# Patient Record
Sex: Female | Born: 1960 | Race: White | Hispanic: No | Marital: Married | Smoking: Never smoker
Health system: Southern US, Community
[De-identification: ages and names within clinical notes are randomized; demographics above are authoritative.]

## PROBLEM LIST (undated history)

## (undated) DIAGNOSIS — Z974 Presence of external hearing-aid: Secondary | ICD-10-CM

## (undated) DIAGNOSIS — K449 Diaphragmatic hernia without obstruction or gangrene: Secondary | ICD-10-CM

## (undated) DIAGNOSIS — Z86010 Personal history of colonic polyps: Secondary | ICD-10-CM

## (undated) DIAGNOSIS — F32A Depression, unspecified: Secondary | ICD-10-CM

## (undated) DIAGNOSIS — Z8719 Personal history of other diseases of the digestive system: Secondary | ICD-10-CM

## (undated) DIAGNOSIS — K649 Unspecified hemorrhoids: Secondary | ICD-10-CM

## (undated) DIAGNOSIS — K219 Gastro-esophageal reflux disease without esophagitis: Secondary | ICD-10-CM

## (undated) DIAGNOSIS — K6282 Dysplasia of anus: Secondary | ICD-10-CM

## (undated) DIAGNOSIS — D013 Carcinoma in situ of anus and anal canal: Secondary | ICD-10-CM

## (undated) DIAGNOSIS — Z860101 Personal history of adenomatous and serrated colon polyps: Secondary | ICD-10-CM

## (undated) DIAGNOSIS — Z973 Presence of spectacles and contact lenses: Secondary | ICD-10-CM

## (undated) DIAGNOSIS — F329 Major depressive disorder, single episode, unspecified: Secondary | ICD-10-CM

## (undated) HISTORY — PX: COLONOSCOPY: SHX174

## (undated) HISTORY — PX: GANGLION CYST EXCISION: SHX1691

## (undated) HISTORY — DX: Gastro-esophageal reflux disease without esophagitis: K21.9

## (undated) HISTORY — DX: Depression, unspecified: F32.A

## (undated) HISTORY — PX: DE QUERVAIN'S RELEASE: SHX1439

---
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
## 2000-07-29 HISTORY — PX: STAPLE HEMORRHOIDECTOMY: SHX2438

## 2015-07-30 HISTORY — PX: PERCUTANEOUS PINNING TOE FRACTURE: SUR1018

## 2019-03-26 ENCOUNTER — Encounter: Payer: Self-pay | Admitting: Internal Medicine

## 2019-05-14 ENCOUNTER — Ambulatory Visit (AMBULATORY_SURGERY_CENTER): Payer: BC Managed Care – PPO | Admitting: *Deleted

## 2019-05-14 ENCOUNTER — Other Ambulatory Visit: Payer: Self-pay

## 2019-05-14 ENCOUNTER — Encounter: Payer: Self-pay | Admitting: Internal Medicine

## 2019-05-14 VITALS — Ht 66.0 in | Wt 131.0 lb

## 2019-05-14 DIAGNOSIS — Z1211 Encounter for screening for malignant neoplasm of colon: Secondary | ICD-10-CM

## 2019-05-14 MED ORDER — NA SULFATE-K SULFATE-MG SULF 17.5-3.13-1.6 GM/177ML PO SOLN: ORAL | 0 refills | Status: DC

## 2019-05-14 NOTE — Progress Notes (Signed)
Patient's pre-visit was done today over the phone with the patient due to COVID-19 pandemic. Name,DOB and address verified. Insurance verified. Packet of Prep instructions mailed to patient including copy of a consent form and pre-procedure patient acknowledgement form-pt is aware. Suprep $15 off Coupon included. Patient understands to call us back with any questions or concerns.   Pt is aware that care partner will wait in the car during procedure; if they feel like they will be too hot to wait in the car; they may wait in the lobby. Patient is aware to bring only one care partner. We want them to wear a mask (we do not have any that we can provide them), practice social distancing, and we will check their temperatures when they get here.  I did remind patient that their care partner needs to stay in the parking lot the entire time. Pt will wear mask into building.  Patient declined Covid screening test, she lives in [place].

## 2019-05-28 ENCOUNTER — Other Ambulatory Visit: Payer: Self-pay

## 2019-05-28 ENCOUNTER — Ambulatory Visit (AMBULATORY_SURGERY_CENTER): Payer: BC Managed Care – PPO | Admitting: Internal Medicine

## 2019-05-28 ENCOUNTER — Other Ambulatory Visit: Payer: Self-pay | Admitting: Internal Medicine

## 2019-05-28 ENCOUNTER — Encounter: Payer: Self-pay | Admitting: Internal Medicine

## 2019-05-28 VITALS — BP 94/71 | HR 52 | Temp 98.1°F | Resp 13 | Ht 66.0 in | Wt 131.0 lb

## 2019-05-28 DIAGNOSIS — Z1211 Encounter for screening for malignant neoplasm of colon: Secondary | ICD-10-CM

## 2019-05-28 DIAGNOSIS — D129 Benign neoplasm of anus and anal canal: Secondary | ICD-10-CM | POA: Diagnosis not present

## 2019-05-28 DIAGNOSIS — D124 Benign neoplasm of descending colon: Secondary | ICD-10-CM | POA: Diagnosis not present

## 2019-05-28 DIAGNOSIS — D123 Benign neoplasm of transverse colon: Secondary | ICD-10-CM | POA: Diagnosis not present

## 2019-05-28 MED ORDER — SODIUM CHLORIDE 0.9 % IV SOLN: 500.0000 mL | Freq: Once | INTRAVENOUS | Status: DC

## 2019-05-28 NOTE — Progress Notes (Signed)
A and O x3. Report to RN. Tolerated MAC anesthesia well.

## 2019-05-28 NOTE — Patient Instructions (Signed)
HANDOUTS given for polyps and hemorrhoids.  YOU HAD AN ENDOSCOPIC PROCEDURE TODAY AT THE [facility]:   Refer to the procedure report that was given to you for any specific questions about what was found during the examination.  If the procedure report does not answer your questions, please call your gastroenterologist to clarify.  If you requested that your care partner not be given the details of your procedure findings, then the procedure report has been included in a sealed envelope for you to review at your convenience later.  YOU SHOULD EXPECT: Some feelings of bloating in the abdomen. Passage of more gas than usual.  Walking can help get rid of the air that was put into your GI tract during the procedure and reduce the bloating. If you had a lower endoscopy (such as a colonoscopy or flexible sigmoidoscopy) you may notice spotting of blood in your stool or on the toilet paper. If you underwent a bowel prep for your procedure, you may not have a normal bowel movement for a few days.  Please Note:  You might notice some irritation and congestion in your nose or some drainage.  This is from the oxygen used during your procedure.  There is no need for concern and it should clear up in a day or so.  SYMPTOMS TO REPORT IMMEDIATELY:   Following lower endoscopy (colonoscopy or flexible sigmoidoscopy):  Excessive amounts of blood in the stool  Significant tenderness or worsening of abdominal pains  Swelling of the abdomen that is new, acute  Fever of 100F or higher  For urgent or emergent issues, a gastroenterologist can be reached at any hour by calling [phone].   DIET:  We do recommend a small meal at first, but then you may proceed to your regular diet.  Drink plenty of fluids but you should avoid alcoholic beverages for 24 hours.  ACTIVITY:  You should plan to take it easy for the rest of today and you should NOT DRIVE or use heavy machinery until tomorrow (because of the  sedation medicines used during the test).    FOLLOW UP: Our staff will call the number listed on your records 48-72 hours following your procedure to check on you and address any questions or concerns that you may have regarding the information given to you following your procedure. If we do not reach you, we will leave a message.  We will attempt to reach you two times.  During this call, we will ask if you have developed any symptoms of COVID 19. If you develop any symptoms (ie: fever, flu-like symptoms, shortness of breath, cough etc.) before then, please call [phone].  If you test positive for Covid 19 in the 2 weeks post procedure, please call and report this information to us.    If any biopsies were taken you will be contacted by phone or by letter within the next 1-3 weeks.  Please call us at [phone] if you have not heard about the biopsies in 3 weeks.    SIGNATURES/CONFIDENTIALITY: You and/or your care partner have signed paperwork which will be entered into your electronic medical record.  These signatures attest to the fact that that the information above on your After Visit Summary has been reviewed and is understood.  Full responsibility of the confidentiality of this discharge information lies with you and/or your care-partner. 

## 2019-05-28 NOTE — Op Note (Signed)
[facility] Patient Name: Joan Bailey Procedure Date: 05/28/2019 9:58 AM MRN: [id number] Endoscopist: Jerene Bears , MD Age: 58 Referring MD:  Date of Birth: 1961-02-16 Gender: Female Account #: [id number] Procedure:                Colonoscopy Indications:              Screening for colorectal malignant neoplasm, Last                            colonoscopy 10 years ago Medicines:                Monitored Anesthesia Care Procedure:                Pre-Anesthesia Assessment:                           - Prior to the procedure, a History and Physical                            was performed, and patient medications and                            allergies were reviewed. The patient's tolerance of                            previous anesthesia was also reviewed. The risks                            and benefits of the procedure and the sedation                            options and risks were discussed with the patient.                            All questions were answered, and informed consent                            was obtained. Prior Anticoagulants: The patient has                            taken no previous anticoagulant or antiplatelet                            agents. ASA Grade Assessment: II - A patient with                            mild systemic disease. After reviewing the risks                            and benefits, the patient was deemed in                            satisfactory condition to undergo the procedure.  After obtaining informed consent, the colonoscope                            was passed under direct vision. Throughout the                            procedure, the patient's blood pressure, pulse, and                            oxygen saturations were monitored continuously. The                            Colonoscope was introduced through the anus and                            advanced to the cecum, identified by  appendiceal                            orifice and ileocecal valve. The colonoscopy was                            performed without difficulty. The patient tolerated                            the procedure well. The quality of the bowel                            preparation was good. The ileocecal valve,                            appendiceal orifice, and rectum were photographed. Scope In: 10:04:45 AM Scope Out: 10:31:59 AM Scope Withdrawal Time: 0 hours 18 minutes 48 seconds  Total Procedure Duration: 0 hours 27 minutes 14 seconds  Findings:                 The digital rectal exam was normal.                           A 5 mm polyp was found in the transverse colon. The                            polyp was sessile. The polyp was removed with a                            cold snare. Resection and retrieval were complete.                           A 4 mm polyp was found in the descending colon. The                            polyp was sessile. The polyp was removed with a  cold biopsy forceps. Resection and retrieval were                            complete.                           A 5 mm polyp was found in the descending colon. The                            polyp was sessile. The polyp was removed with a                            cold snare. Resection and retrieval were complete.                           A 3 mm polyp was found in the anus. The polyp was                            sessile. Biopsies were taken with a cold forceps                            for histology to exclude AIN.                           There was evidence of a prior hemorrhoidectomy in                            the distal rectum. A staple was still present.                           External and internal hemorrhoids were found during                            retroflexion and during digital exam. The                            hemorrhoids were small. Complications:            No  immediate complications. Estimated Blood Loss:     Estimated blood loss was minimal. Impression:               - One 5 mm polyp in the transverse colon, removed                            with a cold snare. Resected and retrieved.                           - One 4 mm polyp in the descending colon, removed                            with a cold biopsy forceps. Resected and retrieved.                           - One 5 mm  polyp in the descending colon, removed                            with a cold snare. Resected and retrieved.                           - One 3 mm polyp in the anal canal. Biopsied to                            exclude AIN.                           - Evidence of previous hemorrhoidectomy,                            characterized by a visible staple.                           - Small external and internal hemorrhoids. Recommendation:           - Patient has a contact number available for                            emergencies. The signs and symptoms of potential                            delayed complications were discussed with the                            patient. Return to normal activities tomorrow.                            Written discharge instructions were provided to the                            patient.                           - Resume previous diet.                           - Continue present medications.                           - Await pathology results.                           - Repeat colonoscopy is recommended. The                            colonoscopy date will be determined after pathology                            results from today's exam become available for                            review.  Jerene Bears, MD 05/28/2019 10:40:12 AM This report has been signed electronically.

## 2019-05-28 NOTE — Progress Notes (Signed)
Called to room to assist during endoscopic procedure.  Patient ID and intended procedure confirmed with present staff. Received instructions for my participation in the procedure from the performing physician.  

## 2019-06-01 ENCOUNTER — Telehealth: Payer: Self-pay

## 2019-06-01 NOTE — Telephone Encounter (Signed)
  Follow up Call-  Call back number 05/28/2019  Post procedure Call Back phone  # [phone]  Permission to leave phone message Yes     Patient questions:  Do you have a fever, pain , or abdominal swelling? No. Pain Score  0 *  Have you tolerated food without any problems? Yes.    Have you been able to return to your normal activities? Yes.    Do you have any questions about your discharge instructions: Diet   No. Medications  No. Follow up visit  No.  Do you have questions or concerns about your Care? No.  Actions: * If pain score is 4 or above: No action needed, pain <4.  1. Have you developed a fever since your procedure? no  2.   Have you had an respiratory symptoms (SOB or cough) since your procedure? no  3.   Have you tested positive for COVID 19 since your procedure no  4.   Have you had any family members/close contacts diagnosed with the COVID 19 since your procedure?  no   If yes to any of these questions please route to Joylene John, RN and Alphonsa Gin, Therapist, sports.

## 2019-06-02 ENCOUNTER — Encounter: Payer: Self-pay | Admitting: Internal Medicine

## 2019-06-07 ENCOUNTER — Telehealth: Payer: Self-pay | Admitting: Internal Medicine

## 2019-06-07 NOTE — Telephone Encounter (Signed)
Definitely okay to wait for this appointment; this issue is not super urgent

## 2019-06-07 NOTE — Telephone Encounter (Signed)
Pt wants to know if her appt with [facility] on 11/30 is ok or if she should try to be seen somewhere else. She wanted to know if this needed to be sooner, if Dr. Hilarie Fredrickson feels it is urgent. Please advise.

## 2019-06-07 NOTE — Telephone Encounter (Signed)
Spoke with pt and she is aware.

## 2019-06-28 ENCOUNTER — Ambulatory Visit: Payer: Self-pay | Admitting: General Surgery

## 2019-06-28 NOTE — H&P (Signed)
History of Present Illness Joan Ruff MD; [id number] 3:07 PM) The patient is a 58 year old female who presents with anal lesions. 58 year old female who presents to the office for evaluation of AIN seen on recent colonoscopy. 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 Colonoscopy report does not mention any other lesions. She is currently asymptomatic. She is status post stapled hemorrhoid pexy in the past. She has no rectal bleeding currently and no anal pain. She occasionally has trouble with anal fissures.   Past Surgical History Mammie Lorenzo, LPN; QA348G X33443 PM) Colon Polyp Removal - Colonoscopy Hemorrhoidectomy  Diagnostic Studies History Mammie Lorenzo, LPN; QA348G X33443 PM) Colonoscopy within last year Mammogram within last year Pap Smear 1-5 years ago  Allergies Mammie Lorenzo, LPN; QA348G 624THL PM) No Known Drug Allergies [06/28/2019]: Allergies Reconciled  Medication History Mammie Lorenzo, LPN; QA348G 624THL PM) Omeprazole (40MG  Capsule DR, Oral) Active. Sertraline HCl (50MG  Tablet, Oral) Active. Tums (500MG  Tablet Chewable, Oral as needed) Active. Medications Reconciled  Social History Mammie Lorenzo, LPN; QA348G X33443 PM) Alcohol use Occasional alcohol use. Caffeine use Coffee. No drug use Tobacco use Never smoker.  Family History Mammie Lorenzo, LPN; QA348G X33443 PM) Depression Sister. Diabetes Mellitus Brother. Heart Disease Father. Malignant Neoplasm Of Pancreas Mother.  Pregnancy / Birth History Mammie Lorenzo, LPN; QA348G X33443 PM) Age of menopause 46-50 Gravida 0 Para 0  Other Problems Mammie Lorenzo, LPN; QA348G X33443 PM) Depression Gastroesophageal Reflux Disease Hemorrhoids     Review of Systems Claiborne Billings Dockery LPN; QA348G X33443 PM) General Not Present- Appetite Loss, Chills, Fatigue, Fever, Night Sweats, Weight Gain and Weight Loss. Skin Not Present- Change in Wart/Mole,  Dryness, Hives, Jaundice, New Lesions, Non-Healing Wounds, Rash and Ulcer. HEENT Present- Hearing Loss, Ringing in the Ears and Wears glasses/contact lenses. Not Present- Earache, Hoarseness, Nose Bleed, Oral Ulcers, Seasonal Allergies, Sinus Pain, Sore Throat, Visual Disturbances and Yellow Eyes. Respiratory Not Present- Bloody sputum, Chronic Cough, Difficulty Breathing, Snoring and Wheezing. Cardiovascular Not Present- Chest Pain, Difficulty Breathing Lying Down, Leg Cramps, Palpitations, Rapid Heart Rate, Shortness of Breath and Swelling of Extremities. Gastrointestinal Present- Hemorrhoids. Not Present- Abdominal Pain, Bloating, Bloody Stool, Change in Bowel Habits, Chronic diarrhea, Constipation, Difficulty Swallowing, Excessive gas, Gets full quickly at meals, Indigestion, Nausea, Rectal Pain and Vomiting. Female Genitourinary Not Present- Frequency, Nocturia, Painful Urination, Pelvic Pain and Urgency. Musculoskeletal Not Present- Back Pain, Joint Pain, Joint Stiffness, Muscle Pain, Muscle Weakness and Swelling of Extremities. Neurological Not Present- Decreased Memory, Fainting, Headaches, Numbness, Seizures, Tingling, Tremor, Trouble walking and Weakness. Psychiatric Present- Depression. Not Present- Anxiety, Bipolar, Change in Sleep Pattern, Fearful and Frequent crying. Endocrine Not Present- Cold Intolerance, Excessive Hunger, Hair Changes, Heat Intolerance, Hot flashes and New Diabetes. Hematology Not Present- Blood Thinners, Easy Bruising, Excessive bleeding, Gland problems, HIV and Persistent Infections.  Vitals Claiborne Billings Dockery LPN; QA348G 624THL PM) 06/28/2019 3:00 PM Weight: 131.4 lb Height: 66in Body Surface Area: 1.67 m Body Mass Index: 21.21 kg/m  Temp.: 97.42F(Thermal Scan)  Pulse: 57 (Regular)  BP: 118/68 (Sitting, Left Arm, Standard)        Physical Exam Joan Ruff MD; [id number] 3:20 PM)  General Mental Status-Alert. General Appearance-Not  in acute distress. Build & Nutrition-Well nourished. Posture-Normal posture. Gait-Normal.  Head and Neck Head-normocephalic, atraumatic with no lesions or palpable masses. Trachea-midline.  Chest and Lung Exam Chest and lung exam reveals -normal excursion with symmetric chest walls.  Cardiovascular Cardiovascular examination reveals -no digital clubbing, cyanosis, edema, increased warmth or tenderness. Auscultation Rhythm -  Regular.  Abdomen Inspection Inspection of the abdomen reveals - No Hernias. Palpation/Percussion Palpation and Percussion of the abdomen reveal - Soft, Non Tender, No Rigidity (guarding), No hepatosplenomegaly and No Palpable abdominal masses.  Rectal Anorectal Exam External - normal external exam. Internal - normal internal exam.  Neurologic Neurologic evaluation reveals -alert and oriented x 3 with no impairment of recent or remote memory, normal attention span and ability to concentrate, normal sensation and normal coordination.  Musculoskeletal Normal Exam - Bilateral-Upper Extremity Strength Normal and Lower Extremity Strength Normal.   Results Joan Ruff MD; [id number] 3:22 PM) Procedures  Name Value Date ANOSCOPY, DIAGNOSTIC [id number]) [ Hemorrhoids ] Procedure Other: Procedure: Anoscopy....Marland KitchenMarland KitchenSurgeon: Marcello Moores....Marland KitchenMarland KitchenAfter the risks and benefits were explained, verbal consent was obtained for above procedure. A medical assistant chaperone was present thoroughout the entire procedure. ....Marland KitchenMarland KitchenAnesthesia: none....Marland KitchenMarland KitchenDiagnosis: And grade 2....Marland KitchenMarland KitchenFindings: No discrete lesions noted, some discoloration noted around the dentate line circumferentially. no hemorrhoid disease.  Performed: 06/28/2019 3:21 PM    Assessment & Plan Joan Ruff MD; [id number] 3:24 PM)  AIN GRADE II [id number]) Impression: Patient underwent recent colonoscopy. An anal polyp was noted. 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 She is here today for further  evaluation. On exam I do not see any discrete lesions. We discussed serial examinations in the office versus high-resolution anoscopy. We discussed that there is no additional benefit to either one of these procedures when compared to the other based on the literature but there seems to be a theoretical benefit high-resolution anoscopy. The patient has decided to proceed with high-resolution anoscopy. We will biopsy any abnormal lesions. If there are any discrete areas of concern, we will perform laser ablation. We discussed typical postoperative pain and bleeding. We discussed the need for ongoing follow-up.  All questions were answered.

## 2019-09-06 ENCOUNTER — Other Ambulatory Visit (HOSPITAL_COMMUNITY): Payer: BC Managed Care – PPO

## 2019-10-04 ENCOUNTER — Ambulatory Visit: Payer: Self-pay | Admitting: General Surgery

## 2019-10-07 ENCOUNTER — Encounter (HOSPITAL_BASED_OUTPATIENT_CLINIC_OR_DEPARTMENT_OTHER): Payer: Self-pay | Admitting: General Surgery

## 2019-10-07 ENCOUNTER — Ambulatory Visit: Payer: Self-pay | Admitting: General Surgery

## 2019-10-08 ENCOUNTER — Encounter (HOSPITAL_BASED_OUTPATIENT_CLINIC_OR_DEPARTMENT_OTHER): Payer: Self-pay | Admitting: General Surgery

## 2019-10-08 ENCOUNTER — Other Ambulatory Visit: Payer: Self-pay

## 2019-10-08 NOTE — Progress Notes (Signed)
Spoke w/ via phone for pre-op interview--- PT Lab needs dos---- none              Lab results------ no COVID test ------ 10-11-2019 @ [id number] Arrive at ------- 0645 NPO after ------ MN Medications to take morning of surgery ----- Prilosec, Zoloft w/ sips of water Diabetic medication ----- n/a Patient Special Instructions ----- n/a Pre-Op special Istructions ----- n/a Patient verbalized understanding of instructions that were given at this phone interview. Patient denies shortness of breath, chest pain, fever, cough a this phone interview.

## 2019-10-11 ENCOUNTER — Other Ambulatory Visit (HOSPITAL_COMMUNITY)
Admission: RE | Admit: 2019-10-11 | Discharge: 2019-10-11 | Disposition: A | Discharge status: Home / Self Care | Payer: BC Managed Care – PPO | Source: Ambulatory Visit | Attending: General Surgery | Admitting: General Surgery

## 2019-10-11 DIAGNOSIS — Z01812 Encounter for preprocedural laboratory examination: Secondary | ICD-10-CM | POA: Insufficient documentation

## 2019-10-11 DIAGNOSIS — Z20822 Contact with and (suspected) exposure to covid-19: Secondary | ICD-10-CM | POA: Diagnosis not present

## 2019-10-12 LAB — SARS CORONAVIRUS 2 (TAT 6-24 HRS): SARS Coronavirus 2: NEGATIVE

## 2019-10-14 ENCOUNTER — Ambulatory Visit (HOSPITAL_BASED_OUTPATIENT_CLINIC_OR_DEPARTMENT_OTHER): Payer: BC Managed Care – PPO | Admitting: Anesthesiology

## 2019-10-14 ENCOUNTER — Encounter (HOSPITAL_BASED_OUTPATIENT_CLINIC_OR_DEPARTMENT_OTHER)
Admission: RE | Disposition: A | Discharge status: Home / Self Care | Payer: Self-pay | Source: Ambulatory Visit | Attending: General Surgery

## 2019-10-14 ENCOUNTER — Other Ambulatory Visit: Payer: Self-pay

## 2019-10-14 ENCOUNTER — Encounter (HOSPITAL_BASED_OUTPATIENT_CLINIC_OR_DEPARTMENT_OTHER): Payer: Self-pay | Admitting: General Surgery

## 2019-10-14 ENCOUNTER — Ambulatory Visit (HOSPITAL_BASED_OUTPATIENT_CLINIC_OR_DEPARTMENT_OTHER)
Admission: RE | Admit: 2019-10-14 | Discharge: 2019-10-14 | Disposition: A | Discharge status: Home / Self Care | Payer: BC Managed Care – PPO | Source: Ambulatory Visit | Attending: General Surgery | Admitting: General Surgery

## 2019-10-14 DIAGNOSIS — K6282 Dysplasia of anus: Secondary | ICD-10-CM | POA: Insufficient documentation

## 2019-10-14 DIAGNOSIS — K620 Anal polyp: Secondary | ICD-10-CM | POA: Insufficient documentation

## 2019-10-14 DIAGNOSIS — K219 Gastro-esophageal reflux disease without esophagitis: Secondary | ICD-10-CM | POA: Insufficient documentation

## 2019-10-14 DIAGNOSIS — K449 Diaphragmatic hernia without obstruction or gangrene: Secondary | ICD-10-CM | POA: Diagnosis not present

## 2019-10-14 DIAGNOSIS — Z79899 Other long term (current) drug therapy: Secondary | ICD-10-CM | POA: Insufficient documentation

## 2019-10-14 DIAGNOSIS — Z800 Family history of malignant neoplasm of digestive organs: Secondary | ICD-10-CM | POA: Diagnosis not present

## 2019-10-14 DIAGNOSIS — Z8249 Family history of ischemic heart disease and other diseases of the circulatory system: Secondary | ICD-10-CM | POA: Diagnosis not present

## 2019-10-14 DIAGNOSIS — Z833 Family history of diabetes mellitus: Secondary | ICD-10-CM | POA: Insufficient documentation

## 2019-10-14 DIAGNOSIS — Z86010 Personal history of colonic polyps: Secondary | ICD-10-CM | POA: Diagnosis not present

## 2019-10-14 DIAGNOSIS — F329 Major depressive disorder, single episode, unspecified: Secondary | ICD-10-CM | POA: Diagnosis not present

## 2019-10-14 DIAGNOSIS — Z818 Family history of other mental and behavioral disorders: Secondary | ICD-10-CM | POA: Diagnosis not present

## 2019-10-14 HISTORY — DX: Dysplasia of anus: K62.82

## 2019-10-14 HISTORY — DX: Presence of external hearing-aid: Z97.4

## 2019-10-14 HISTORY — PX: HIGH RESOLUTION ANOSCOPY: SHX6345

## 2019-10-14 HISTORY — DX: Personal history of other diseases of the digestive system: Z87.19

## 2019-10-14 HISTORY — DX: Personal history of colonic polyps: Z86.010

## 2019-10-14 HISTORY — DX: Unspecified hemorrhoids: K64.9

## 2019-10-14 HISTORY — DX: Personal history of adenomatous and serrated colon polyps: Z86.0101

## 2019-10-14 HISTORY — DX: Diaphragmatic hernia without obstruction or gangrene: K44.9

## 2019-10-14 HISTORY — DX: Presence of spectacles and contact lenses: Z97.3

## 2019-10-14 SURGERY — ANOSCOPY, HIGH RESOLUTION
Anesthesia: Monitor Anesthesia Care | Site: Rectum

## 2019-10-14 MED ORDER — SODIUM CHLORIDE 0.9 % IV SOLN
INTRAVENOUS | Status: DC | PRN
  Administered 2019-10-14: 20 ug/kg/min via INTRAVENOUS

## 2019-10-14 MED ORDER — BUPIVACAINE LIPOSOME 1.3 % IJ SUSP
INTRAMUSCULAR | Status: DC | PRN
  Administered 2019-10-14: 20 mL

## 2019-10-14 MED ORDER — SODIUM CHLORIDE 0.9% FLUSH
3.0000 mL | INTRAVENOUS | Status: DC | PRN
  Filled 2019-10-14: qty 3

## 2019-10-14 MED ORDER — MEPERIDINE HCL 25 MG/ML IJ SOLN
6.2500 mg | INTRAMUSCULAR | Status: DC | PRN
  Filled 2019-10-14: qty 1

## 2019-10-14 MED ORDER — MIDAZOLAM HCL 2 MG/2ML IJ SOLN
INTRAMUSCULAR | Status: AC
  Filled 2019-10-14: qty 2

## 2019-10-14 MED ORDER — ACETAMINOPHEN 500 MG PO TABS
1000.0000 mg | ORAL_TABLET | ORAL | Status: AC
  Administered 2019-10-14: 1000 mg via ORAL
  Filled 2019-10-14: qty 2

## 2019-10-14 MED ORDER — ACETAMINOPHEN 650 MG RE SUPP
650.0000 mg | RECTAL | Status: DC | PRN
  Filled 2019-10-14: qty 1

## 2019-10-14 MED ORDER — SODIUM CHLORIDE 0.9 % IV SOLN
250.0000 mL | INTRAVENOUS | Status: DC | PRN
  Filled 2019-10-14: qty 250

## 2019-10-14 MED ORDER — LIDOCAINE 2% (20 MG/ML) 5 ML SYRINGE
INTRAMUSCULAR | Status: AC
  Filled 2019-10-14: qty 5

## 2019-10-14 MED ORDER — ACETIC ACID 5 % SOLN
Status: DC | PRN
  Administered 2019-10-14: 1 via TOPICAL

## 2019-10-14 MED ORDER — MIDAZOLAM HCL 2 MG/2ML IJ SOLN
INTRAMUSCULAR | Status: DC | PRN
  Administered 2019-10-14: 2 mg via INTRAVENOUS

## 2019-10-14 MED ORDER — ACETAMINOPHEN 325 MG PO TABS
325.0000 mg | ORAL_TABLET | ORAL | Status: DC | PRN
  Filled 2019-10-14: qty 2

## 2019-10-14 MED ORDER — OXYCODONE HCL 5 MG/5ML PO SOLN
5.0000 mg | Freq: Once | ORAL | Status: DC | PRN
  Filled 2019-10-14: qty 5

## 2019-10-14 MED ORDER — TRAMADOL HCL 50 MG PO TABS: 50.0000 mg | ORAL_TABLET | Freq: Four times a day (QID) | ORAL | 0 refills | Status: DC | PRN

## 2019-10-14 MED ORDER — BUPIVACAINE-EPINEPHRINE 0.5% -1:200000 IJ SOLN
INTRAMUSCULAR | Status: DC | PRN
  Administered 2019-10-14: 30 mL

## 2019-10-14 MED ORDER — OXYCODONE HCL 5 MG PO TABS
5.0000 mg | ORAL_TABLET | ORAL | Status: DC | PRN
  Filled 2019-10-14: qty 2

## 2019-10-14 MED ORDER — FENTANYL CITRATE (PF) 100 MCG/2ML IJ SOLN
25.0000 ug | INTRAMUSCULAR | Status: DC | PRN
  Filled 2019-10-14: qty 1

## 2019-10-14 MED ORDER — ACETAMINOPHEN 500 MG PO TABS
ORAL_TABLET | ORAL | Status: AC
  Filled 2019-10-14: qty 2

## 2019-10-14 MED ORDER — LACTATED RINGERS IV SOLN
INTRAVENOUS | Status: DC
  Filled 2019-10-14: qty 1000

## 2019-10-14 MED ORDER — ONDANSETRON HCL 4 MG/2ML IJ SOLN
INTRAMUSCULAR | Status: AC
  Filled 2019-10-14: qty 2

## 2019-10-14 MED ORDER — ONDANSETRON HCL 4 MG/2ML IJ SOLN
4.0000 mg | Freq: Once | INTRAMUSCULAR | Status: DC | PRN
  Filled 2019-10-14: qty 2

## 2019-10-14 MED ORDER — OXYCODONE HCL 5 MG PO TABS
5.0000 mg | ORAL_TABLET | Freq: Once | ORAL | Status: DC | PRN
  Filled 2019-10-14: qty 1

## 2019-10-14 MED ORDER — DEXAMETHASONE SODIUM PHOSPHATE 4 MG/ML IJ SOLN
INTRAMUSCULAR | Status: DC | PRN
  Administered 2019-10-14: 5 mg via INTRAVENOUS

## 2019-10-14 MED ORDER — ACETAMINOPHEN 160 MG/5ML PO SOLN
325.0000 mg | ORAL | Status: DC | PRN
  Filled 2019-10-14: qty 20.3

## 2019-10-14 MED ORDER — DEXAMETHASONE SODIUM PHOSPHATE 10 MG/ML IJ SOLN
INTRAMUSCULAR | Status: AC
  Filled 2019-10-14: qty 1

## 2019-10-14 MED ORDER — PROPOFOL 500 MG/50ML IV EMUL
INTRAVENOUS | Status: AC
  Filled 2019-10-14: qty 50

## 2019-10-14 MED ORDER — PROPOFOL 500 MG/50ML IV EMUL
INTRAVENOUS | Status: DC | PRN
  Administered 2019-10-14: 200 ug/kg/min via INTRAVENOUS

## 2019-10-14 MED ORDER — LIDOCAINE 2% (20 MG/ML) 5 ML SYRINGE
INTRAMUSCULAR | Status: DC | PRN
  Administered 2019-10-14: 25 mg via INTRAVENOUS

## 2019-10-14 MED ORDER — ONDANSETRON HCL 4 MG/2ML IJ SOLN
INTRAMUSCULAR | Status: DC | PRN
  Administered 2019-10-14: 4 mg via INTRAVENOUS

## 2019-10-14 MED ORDER — SODIUM CHLORIDE 0.9% FLUSH
3.0000 mL | Freq: Two times a day (BID) | INTRAVENOUS | Status: DC
  Filled 2019-10-14: qty 3

## 2019-10-14 MED ORDER — ACETAMINOPHEN 325 MG PO TABS
650.0000 mg | ORAL_TABLET | ORAL | Status: DC | PRN
  Filled 2019-10-14: qty 2

## 2019-10-14 SURGICAL SUPPLY — 50 items
APPLICATOR COTTON TIP 6 STRL (MISCELLANEOUS) IMPLANT
APPLICATOR COTTON TIP 6IN STRL (MISCELLANEOUS)
BENZOIN TINCTURE PRP APPL 2/3 (GAUZE/BANDAGES/DRESSINGS) ×4 IMPLANT
BLADE EXTENDED COATED 6.5IN (ELECTRODE) IMPLANT
BLADE HEX COATED 2.75 (ELECTRODE) ×4 IMPLANT
BLADE SURG 10 STRL SS (BLADE) ×4 IMPLANT
BRIEF STRETCH FOR OB PAD LRG (UNDERPADS AND DIAPERS) ×4 IMPLANT
CANISTER SUCT 3000ML PPV (MISCELLANEOUS) ×4 IMPLANT
COVER BACK TABLE 60X90IN (DRAPES) ×4 IMPLANT
COVER MAYO STAND STRL (DRAPES) ×4 IMPLANT
COVER WAND RF STERILE (DRAPES) ×8 IMPLANT
DECANTER SPIKE VIAL GLASS SM (MISCELLANEOUS) ×4 IMPLANT
DRAPE LAPAROTOMY 100X72 PEDS (DRAPES) ×4 IMPLANT
DRAPE UTILITY XL STRL (DRAPES) ×4 IMPLANT
DRSG PAD ABDOMINAL 8X10 ST (GAUZE/BANDAGES/DRESSINGS) ×4 IMPLANT
ELECT REM PT RETURN 9FT ADLT (ELECTROSURGICAL) ×4
ELECTRODE REM PT RTRN 9FT ADLT (ELECTROSURGICAL) ×2 IMPLANT
GAUZE SPONGE 4X4 12PLY STRL (GAUZE/BANDAGES/DRESSINGS) ×4 IMPLANT
GLOVE BIO SURGEON STRL SZ 6.5 (GLOVE) ×3 IMPLANT
GLOVE BIO SURGEONS STRL SZ 6.5 (GLOVE) ×1
GLOVE BIOGEL PI IND STRL 7.0 (GLOVE) ×4 IMPLANT
GLOVE BIOGEL PI IND STRL 7.5 (GLOVE) ×2 IMPLANT
GLOVE BIOGEL PI IND STRL 8.5 (GLOVE) ×2 IMPLANT
GLOVE BIOGEL PI INDICATOR 7.0 (GLOVE) ×4
GLOVE BIOGEL PI INDICATOR 7.5 (GLOVE) ×2
GLOVE BIOGEL PI INDICATOR 8.5 (GLOVE) ×2
GLOVE INDICATOR 8.5 STRL (GLOVE) ×4 IMPLANT
GOWN SRG XL 47XLVL 3 REINF (GOWN DISPOSABLE) ×6 IMPLANT
GOWN STRL REIN XL LVL3 (GOWN DISPOSABLE) ×6
GOWN STRL REUS W/TWL XL LVL3 (GOWN DISPOSABLE) ×4 IMPLANT
KIT SIGMOIDOSCOPE (SET/KITS/TRAYS/PACK) IMPLANT
KIT TURNOVER CYSTO (KITS) ×4 IMPLANT
NDL SAFETY ECLIPSE 18X1.5 (NEEDLE) IMPLANT
NEEDLE HYPO 18GX1.5 SHARP (NEEDLE)
NEEDLE HYPO 22GX1.5 SAFETY (NEEDLE) ×4 IMPLANT
NS IRRIG 500ML POUR BTL (IV SOLUTION) ×4 IMPLANT
PACK BASIN DAY SURGERY FS (CUSTOM PROCEDURE TRAY) ×4 IMPLANT
PAD ARMBOARD 7.5X6 YLW CONV (MISCELLANEOUS) IMPLANT
PENCIL BUTTON HOLSTER BLD 10FT (ELECTRODE) ×4 IMPLANT
SPONGE SURGIFOAM ABS GEL 12-7 (HEMOSTASIS) IMPLANT
SUT CHROMIC 2 0 SH (SUTURE) IMPLANT
SUT CHROMIC 3 0 SH 27 (SUTURE) ×4 IMPLANT
SYR BULB IRRIGATION 50ML (SYRINGE) ×4 IMPLANT
SYR CONTROL 10ML LL (SYRINGE) ×4 IMPLANT
TOWEL OR 17X26 10 PK STRL BLUE (TOWEL DISPOSABLE) ×8 IMPLANT
TRAY DSU PREP LF (CUSTOM PROCEDURE TRAY) ×4 IMPLANT
TUBE CONNECTING 12'X1/4 (SUCTIONS) ×1
TUBE CONNECTING 12X1/4 (SUCTIONS) ×3 IMPLANT
VACUUM HOSE 7/8X10 W/ WAND (MISCELLANEOUS) ×4 IMPLANT
YANKAUER SUCT BULB TIP NO VENT (SUCTIONS) ×8 IMPLANT

## 2019-10-14 NOTE — Discharge Instructions (Addendum)
Beginning the day after surgery:  You may sit in a tub of warm water 2-3 times a day to relieve discomfort.  Eat a regular diet high in fiber.  Avoid foods that give you constipation or diarrhea.  Avoid foods that are difficult to digest, such as seeds, nuts, corn or popcorn.  Do not go any longer than 2 days without a bowel movement.  You may take a dose of Milk of Magnesia if you become constipated.    Drink 6-8 glasses of water daily.  Walking is encouraged.  Avoid strenuous activity and heavy lifting for one month after surgery.    Call the office if you have any questions or concerns.  Call immediately if you develop:   Excessive rectal bleeding (more than a cup or passing large clots)  Increased discomfort  Fever greater than 100 F  Difficulty urinating  Post Anesthesia Home Care Instructions  Activity: Get plenty of rest for the remainder of the day. A responsible adult should stay with you for 24 hours following the procedure.  For the next 24 hours, DO NOT: -Drive a car -Paediatric nurse -Drink alcoholic beverages -Take any medication unless instructed by your physician -Make any legal decisions or sign important papers.  Meals: Start with liquid foods such as gelatin or soup. Progress to regular foods as tolerated. Avoid greasy, spicy, heavy foods. If nausea and/or vomiting occur, drink only clear liquids until the nausea and/or vomiting subsides. Call your physician if vomiting continues.  Special Instructions/Symptoms: Your throat may feel dry or sore from the anesthesia or the breathing tube placed in your throat during surgery. If this causes discomfort, gargle with warm salt water. The discomfort should disappear within 24 hours.  If you had a scopolamine patch placed behind your ear for the management of post- operative nausea and/or vomiting:  1. The medication in the patch is effective for 72 hours, after which it should be removed.  Wrap patch in a tissue  and discard in the trash. Wash hands thoroughly with soap and water. 2. You may remove the patch earlier than 72 hours if you experience unpleasant side effects which may include dry mouth, dizziness or visual disturbances. 3. Avoid touching the patch. Wash your hands with soap and water after contact with the patch.   Information for Discharge Teaching: EXPAREL (bupivacaine liposome injectable suspension)   Your surgeon gave you EXPAREL(bupivacaine) in your surgical incision to help control your pain after surgery.   EXPAREL is a local anesthetic that provides pain relief by numbing the tissue around the surgical site.  EXPAREL is designed to release pain medication over time and can control pain for up to 72 hours.  Depending on how you respond to EXPAREL, you may require less pain medication during your recovery.  Possible side effects:  Temporary loss of sensation or ability to move in the area where bupivacaine was injected.  Nausea, vomiting, constipation  Rarely, numbness and tingling in your mouth or lips, lightheadedness, or anxiety may occur.  Call your doctor right away if you think you may be experiencing any of these sensations, or if you have other questions regarding possible side effects.  Follow all other discharge instructions given to you by your surgeon or nurse. Eat a healthy diet and drink plenty of water or other fluids.  If you return to the hospital for any reason within 96 hours following the administration of EXPAREL, please inform your health care providers.

## 2019-10-14 NOTE — Transfer of Care (Signed)
Immediate Anesthesia Transfer of Care Note  Patient: Joan Bailey  Procedure(s) Performed: HIGH RESOLUTION ANOSCOPY,WITH BIOPSY (N/A Rectum)  Patient Location: PACU  Anesthesia Type:MAC  Level of Consciousness: awake, alert , oriented and patient cooperative  Airway & Oxygen Therapy: Patient Spontanous Breathing and Patient connected to nasal cannula oxygen  Post-op Assessment: Report given to RN and Post -op Vital signs reviewed and stable  Post vital signs: Reviewed and stable  Last Vitals:  Vitals Value Taken Time  BP 104/61 10/14/19 1043  Temp    Pulse 58 10/14/19 1045  Resp 15 10/14/19 1045  SpO2 100 % 10/14/19 1045  Vitals shown include unvalidated device data.  Last Pain:  Vitals:   10/14/19 0736  TempSrc: Oral         Complications: No apparent anesthesia complications

## 2019-10-14 NOTE — H&P (Signed)
The patient is a 59 year old female who presents with anal lesions. 59 year old female who presents to the office for evaluation of AIN seen on recent colonoscopy. 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 Colonoscopy report does not mention any other lesions. She is currently asymptomatic. She is status post stapled hemorrhoidectomy in the past. She has no rectal bleeding currently and no anal pain. She occasionally has trouble with anal fissures.   Past Surgical History  Colon Polyp Removal - Colonoscopy Hemorrhoidectomy  Diagnostic Studies History  Colonoscopy within last year Mammogram within last year Pap Smear 1-5 years ago  Allergies  No Known Drug Allergies [06/28/2019]: Allergies Reconciled  Medication History  Omeprazole (40MG  Capsule DR, Oral) Active. Sertraline HCl (50MG  Tablet, Oral) Active. Tums (500MG  Tablet Chewable, Oral as needed) Active. Medications Reconciled  Social History  Alcohol use Occasional alcohol use. Caffeine use Coffee. No drug use Tobacco use Never smoker.  Family History  Depression Sister. Diabetes Mellitus Brother. Heart Disease Father. Malignant Neoplasm Of Pancreas Mother.  Pregnancy / Birth History  Age of menopause 104-50 Gravida 0 Para 0  Other Problems  Depression Gastroesophageal Reflux Disease Hemorrhoids   Review of Systems  General Not Present- Appetite Loss, Chills, Fatigue, Fever, Night Sweats, Weight Gain and Weight Loss. Skin Not Present- Change in Wart/Mole, Dryness, Hives, Jaundice, New Lesions, Non-Healing Wounds, Rash and Ulcer. HEENT Present- Hearing Loss, Ringing in the Ears and Wears glasses/contact lenses. Not Present- Earache, Hoarseness, Nose Bleed, Oral Ulcers, Seasonal Allergies, Sinus Pain, Sore Throat, Visual Disturbances and Yellow Eyes. Respiratory Not Present- Bloody sputum, Chronic Cough, Difficulty Breathing, Snoring and Wheezing. Cardiovascular Not  Present- Chest Pain, Difficulty Breathing Lying Down, Leg Cramps, Palpitations, Rapid Heart Rate, Shortness of Breath and Swelling of Extremities. Gastrointestinal Present- Hemorrhoids. Not Present- Abdominal Pain, Bloating, Bloody Stool, Change in Bowel Habits, Chronic diarrhea, Constipation, Difficulty Swallowing, Excessive gas, Gets full quickly at meals, Indigestion, Nausea, Rectal Pain and Vomiting. Female Genitourinary Not Present- Frequency, Nocturia, Painful Urination, Pelvic Pain and Urgency. Musculoskeletal Not Present- Back Pain, Joint Pain, Joint Stiffness, Muscle Pain, Muscle Weakness and Swelling of Extremities. Neurological Not Present- Decreased Memory, Fainting, Headaches, Numbness, Seizures, Tingling, Tremor, Trouble walking and Weakness. Psychiatric Present- Depression. Not Present- Anxiety, Bipolar, Change in Sleep Pattern, Fearful and Frequent crying. Endocrine Not Present- Cold Intolerance, Excessive Hunger, Hair Changes, Heat Intolerance, Hot flashes and New Diabetes. Hematology Not Present- Blood Thinners, Easy Bruising, Excessive bleeding, Gland problems, HIV and Persistent Infections.  BP 116/72   Pulse (!) 53   Temp (!) 97.5 F (36.4 C) (Oral)   Resp 14   Ht 5\' 6"  (1.676 m)   Wt 59 kg   SpO2 100%   BMI 20.98 kg/m      Physical Exam   General Mental Status-Alert. General Appearance-Not in acute distress. Build & Nutrition-Well nourished. Posture-Normal posture. Gait-Normal.  Head and Neck Head-normocephalic, atraumatic with no lesions or palpable masses. Trachea-midline.  Chest and Lung Exam Chest and lung exam reveals -normal excursion with symmetric chest walls.  Cardiovascular Cardiovascular examination reveals -no digital clubbing, cyanosis, edema, increased warmth or tenderness. Auscultation Rhythm - Regular.  Abdomen Inspection Inspection of the abdomen reveals - No Hernias. Palpation/Percussion Palpation and  Percussion of the abdomen reveal - Soft, Non Tender, No Rigidity (guarding), No hepatosplenomegaly and No Palpable abdominal masses.  Rectal Anorectal Exam External - normal external exam. Internal - normal internal exam.  Neurologic Neurologic evaluation reveals -alert and oriented x 3 with no impairment of recent  or remote memory, normal attention span and ability to concentrate, normal sensation and normal coordination.  Musculoskeletal Normal Exam - Bilateral-Upper Extremity Strength Normal and Lower Extremity Strength Normal.  ANOSCOPY, DIAGNOSTIC [id number]) [ Hemorrhoids ] Procedure Other: Procedure: Anoscopy....Marland KitchenMarland KitchenSurgeon: Marcello Moores....Marland KitchenMarland KitchenAfter the risks and benefits were explained, verbal consent was obtained for above procedure. A medical assistant chaperone was present thoroughout the entire procedure. ....Marland KitchenMarland KitchenAnesthesia: none....Marland KitchenMarland KitchenDiagnosis: And grade 2....Marland KitchenMarland KitchenFindings: No discrete lesions noted, some discoloration noted around the dentate line circumferentially. no hemorrhoid disease.  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 An anal polyp was noted. 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 She is here today for further evaluation. On exam I do not see any discrete lesions. We discussed serial examinations in the office versus high-resolution anoscopy. We discussed that there is no additional benefit to either one of these procedures when compared to the other based on the literature but there seems to be a theoretical benefit high-resolution anoscopy. The patient has decided to proceed with high-resolution anoscopy. We will biopsy any abnormal lesions. If there are any discrete areas of concern, we will perform laser ablation. We discussed typical postoperative pain and bleeding. We discussed the need for ongoing follow-up.  All questions were answered.

## 2019-10-14 NOTE — Anesthesia Preprocedure Evaluation (Addendum)
Anesthesia Evaluation  Patient identified by MRN, date of birth, ID band Patient awake    Reviewed: Allergy & Precautions, H&P , NPO status , Patient's Chart, lab work & pertinent test results, reviewed documented beta blocker date and time   Airway Mallampati: I  TM Distance: >3 FB Neck ROM: full    Dental no notable dental hx. (+) Dental Advisory Given, Teeth Intact, Caps   Pulmonary neg pulmonary ROS,    Pulmonary exam normal breath sounds clear to auscultation       Cardiovascular Exercise Tolerance: Good negative cardio ROS   Rhythm:regular Rate:Normal     Neuro/Psych PSYCHIATRIC DISORDERS Depression negative neurological ROS     GI/Hepatic Neg liver ROS, hiatal hernia, GERD  Medicated,  Endo/Other  negative endocrine ROS  Renal/GU negative Renal ROS  negative genitourinary   Musculoskeletal   Abdominal   Peds  Hematology negative hematology ROS (+)   Anesthesia Other Findings   Reproductive/Obstetrics negative OB ROS                            Anesthesia Physical Anesthesia Plan  ASA: II  Anesthesia Plan: MAC   Post-op Pain Management:    Induction: Intravenous  PONV Risk Score and Plan:   Airway Management Planned: Mask and Natural Airway  Additional Equipment:   Intra-op Plan:   Post-operative Plan:   Informed Consent: I have reviewed the patients History and Physical, chart, labs and discussed the procedure including the risks, benefits and alternatives for the proposed anesthesia with the patient or authorized representative who has indicated his/her understanding and acceptance.     Dental Advisory Given  Plan Discussed with: CRNA and Anesthesiologist  Anesthesia Plan Comments:         Anesthesia Quick Evaluation

## 2019-10-14 NOTE — Op Note (Signed)
10/14/2019  10:37 AM  PATIENT:  Joan Bailey  59 y.o. female  Patient Care Team: Normand Sloop, MD as PCP - General (Family Medicine)  PRE-OPERATIVE DIAGNOSIS:  AIN II  POST-OPERATIVE DIAGNOSIS:  AIN II  PROCEDURE:  HIGH RESOLUTION ANOSCOPY,WITH BIOPSY   Surgeon(s): Leighton Ruff, MD  ASSISTANT: none   ANESTHESIA:   local and MAC  EBL: 100m    SPECIMEN:  Source of Specimen:  L anterior anal canal  DISPOSITION OF SPECIMEN:  PATHOLOGY  COUNTS:  YES  PLAN OF CARE: Discharge to home after PACU  PATIENT DISPOSITION:  PACU - hemodynamically stable.  INDICATION: 59y.o. F with discrete lesion in anal canal   OR FINDINGS: discrete lesion in L anterior anal canal  DESCRIPTION: The patient was identified in the preoperative holding area and taken to the OR where they were laid prone on the operating room table in jack knife position.  MAC anesthesia was smoothly induced.  The patient was then prepped and draped in the usual sterile fashion. A surgical timeout was performed indicating the correct patient, procedure, positioning and preoperative antibioitics. SCDs were noted to be in place and functioning prior to the operation.   After this was completed, a sponge was soaked in 5% acetic acid was placed over the perianal region. This was allowed to soak for 2 minutes. The sponge was removed and the perianal region was evaluated with a colposcope.  There were no external lesions.  The internal anal canal was evaluated via anoscopy with a Hill-Ferguson anoscope.  There was a group of polypoid lesions in the left anterior anal canal.  No other lesions were noted.  After this was completed, hemostasis was achieved with electrocautery and all of the biopsy sites were closed using a 3-0 chromic suture.   The patient was then awakened from anesthesia and sent to the postanesthesia care unit in stable condition. All counts were correct operating room staff.

## 2019-10-14 NOTE — Anesthesia Procedure Notes (Signed)
Procedure Name: MAC Date/Time: 10/14/2019 10:00 AM Performed by: Wanita Chamberlain, CRNA Pre-anesthesia Checklist: Patient identified, Emergency Drugs available, Suction available and Patient being monitored Patient Re-evaluated:Patient Re-evaluated prior to induction Oxygen Delivery Method: Nasal cannula Preoxygenation: Pre-oxygenation with 100% oxygen Induction Type: IV induction Placement Confirmation: breath sounds checked- equal and bilateral,  CO2 detector and positive ETCO2

## 2019-10-15 LAB — SURGICAL PATHOLOGY

## 2019-10-17 NOTE — Anesthesia Postprocedure Evaluation (Signed)
Anesthesia Post Note  Patient: Joan Bailey  Procedure(s) Performed: HIGH RESOLUTION ANOSCOPY,WITH BIOPSY (N/A Rectum)     Patient location during evaluation: PACU Anesthesia Type: MAC Level of consciousness: awake and alert Pain management: pain level controlled Vital Signs Assessment: post-procedure vital signs reviewed and stable Respiratory status: spontaneous breathing, nonlabored ventilation, respiratory function stable and patient connected to nasal cannula oxygen Cardiovascular status: stable and blood pressure returned to baseline Postop Assessment: no apparent nausea or vomiting Anesthetic complications: no    Last Vitals:  Vitals:   10/14/19 1100 10/14/19 1120  BP: 114/70 130/81  Pulse: (!) 57 82  Resp: 13 16  Temp: 36.4 C 36.8 C  SpO2: 100% 100%    Last Pain:  Vitals:   10/15/19 1321  TempSrc:   PainSc: 3    Pain Goal:                   [name]

## 2020-08-11 ENCOUNTER — Ambulatory Visit: Payer: BC Managed Care – PPO | Admitting: Gastroenterology

## 2020-08-17 ENCOUNTER — Other Ambulatory Visit: Payer: Self-pay

## 2020-08-17 ENCOUNTER — Ambulatory Visit: Payer: BC Managed Care – PPO | Admitting: Nurse Practitioner

## 2020-08-17 ENCOUNTER — Telehealth: Payer: Self-pay

## 2020-08-17 ENCOUNTER — Encounter: Payer: Self-pay | Admitting: Nurse Practitioner

## 2020-08-17 VITALS — BP 90/60 | HR 52 | Ht 66.0 in | Wt 131.0 lb

## 2020-08-17 DIAGNOSIS — K862 Cyst of pancreas: Secondary | ICD-10-CM | POA: Diagnosis not present

## 2020-08-17 DIAGNOSIS — M7918 Myalgia, other site: Secondary | ICD-10-CM

## 2020-08-17 DIAGNOSIS — R1013 Epigastric pain: Secondary | ICD-10-CM | POA: Diagnosis not present

## 2020-08-17 DIAGNOSIS — K824 Cholesterolosis of gallbladder: Secondary | ICD-10-CM

## 2020-08-17 DIAGNOSIS — R079 Chest pain, unspecified: Secondary | ICD-10-CM | POA: Diagnosis not present

## 2020-08-17 NOTE — Patient Instructions (Addendum)
If you are age 62 or older, your body mass index should be between 23-30. Your Body mass index is 21.14 kg/m. If this is out of the aforementioned range listed, please consider follow up with your Primary Care Provider.  If you are age 36 or younger, your body mass index should be between 19-25. Your Body mass index is 21.14 kg/m. If this is out of the aformentioned range listed, please consider follow up with your Primary Care Provider.   You have been scheduled for an endoscopy. Please follow written instructions given to you at your visit today. If you use inhalers (even only as needed), please bring them with you on the day of your procedure.  Use Salon Pas patches as directed for musculoskeletal pain.  Follow up pending the results of your Endoscopy or as needed.  Thank you for entrusting me with your care and choosing [facility].  Tye Savoy, NP-C

## 2020-08-17 NOTE — Telephone Encounter (Signed)
Contacted patient.  Agrees to MRI MRCP on 08/24/20 arrive at 6:30 am fasting for a 7:00 am MRI/MRCP w/wo contrast. She will get her BUN and creatinine drawn on the day of her [facility] procedure.

## 2020-08-17 NOTE — Progress Notes (Signed)
Addendum: Reviewed and agree with assessment and management plan. Given the 1.3 cm cystic mass in the pancreatic head by ultrasound, I would recommend more dedicated and advanced imaging of the pancreas.  This may be a simple cyst but MRI should help best define. I recommend proceeding with MRI abdomen with and without contrast plus MRCP This will also further evaluate the gallbladder polyp which is very likely benign.  Would consider repeat gallbladder ultrasound in 1 year. Quantez Schnyder, Lajuan Lines, MD

## 2020-08-17 NOTE — Progress Notes (Signed)
ASSESSMENT AND PLAN     # Chronic GERD. She was well controlled on daily Omeprazole for years until several months ago. Now with breakthrough burning in chest / throat and epigastrium.  No improvement in symptoms despite changing PPI a couple of weeks ago.  --Patient will be scheduled for EGD for evaluation of burning in chest and epigastrium on PPI and compliant with anti-reflux measures. The risks and benefits of EGD were discussed and the patient agrees to proceed.  --Continue Pantoprazole. I have asked to take in am 30 minutes BEFORE breakfast.  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  --I will discuss with Dr. Hilarie Fredrickson, patient's primary GI but [place] can probably be repeated in a year to check for interval growth of gallbladder polyp. Regarding the simple pancreatic cyst, he decide whether more advanced imaging of pancreas is needed  #  Almost constant right flank discomfort sometimes radiating around to right scapula. Patient feels like discomfort is musculoskeletal and related to lifting / moving boxes. At this point I agree with her. It certainly doesn't seem GI related. Also, no CVA tenderness. No skin lesions in the area.  --Recommended she try Salon Pas patches on the area  # Hx of adenomatous colon polyps / sessile serrated polyps in October 2020, 5 year colonoscopy recommended.   --Patient is retiring and will need to change insurance providers for the next few years. She lives in [place], it sounds like we may not be covered under her new insurance plan.  However, by the time her next colonoscopy is due patient will be on Medicare and come back here for her colonoscopy.    # History of anal intraepithelial neoplasm resected 2020 ( Dr. Marcello Moores with [facility]). Sounds like Dr. Marcello Moores maybe arranging for patient to have follow up with someone in [place]      Primary Gastroenterologist : Zenovia Jarred, MD   Chief Complaint : epigastric pain , heartburn  Joan Bailey is a 60 y.o. 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 She is accompanied by her husband Eduard Clos. She was diagnosed with GERD in 1992 and has been on anti-reflux medications since.  Through the years she has been on various medications that either quit working or insurance stopped paying for them. Nexium worked well for a couple of years ago but insurance made her change to Omeprazole which she has taken for the last last several years. She started having breakthrough symptoms several months ago with epigastric discomfort and burning in chest and throat. No radiation into arms / jaws. Symptoms occur daily but do not bother her during the night.  She has no acid regurgitation, just the burning. The discomfort is not related to exertion. She has no SOB. She avoids spicy foods. She has 2 cups of coffee in the am , otherwise mainly drinks water the remainder of the day. She generally does not lay down for 2 hours after having dinner. She hasn't recently gained any weight. Rarely takes NSAIDS. For breakthrough GERD symptoms her PCP changed her to [facility] 08/08/20. A couple of times she had nausea after taking the Protonix so she changed to taking it after breakfast .  Patient recently saw GYN for annual exam. Her fingerstick hgb was in the 9 range. She reported some RUQ discomfort so [place]. Lab repeated ( venipuncture) and hgb was actually  normal. She points to area over right lateral rib cage , a few inches below axilla. The discomfort may radiate into right shoulder bladed. The discomfort seems to always be present, it is not related to eating. She sometimes gets discomfort over right shoulder blade as well. Patient  thinks the pain could be musculoskeletal because if started after she had been moving / lifing some boxes.   Data Reviewed: 07/14/2020 Abdominal US - [place] -The liver is normal in size and echotexture.  Portal vein demonstrates hepatopetal flow.  CBD 0.2 cm.  No biliary duct dilation.  Gallbladder normal with normal wall thickness.  2 mm nonmobile echogenic focus along the gallbladder wall likely a polyp.  Pancreas is normal in size.  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  Kidneys are normal appearing.  The spleen is normal in size and echotexture.  Aorta is normal in size and without evidence of aneurysm.  Previous Endoscopic Evaluations / Pertinent Studies:   Oct 2020 Screening colonoscopy  The digital rectal exam was normal. - A 5 mm polyp was found in the transverse colon. The polyp was sessile. The polyp was removed with a cold snare. Resection and retrieval were complete. - A 4 mm polyp was found in the descending colon. The polyp was sessile. The polyp was removed with a cold biopsy forceps. Resection and retrieval were complete. - A 5 mm polyp was found in the descending colon. The polyp was sessile. The polyp was removed with a cold snare. Resection and retrieval were complete. - A 3 mm polyp was found in the anus. The polyp was sessile. Biopsies were taken with a cold forceps for histology to exclude AIN. - There was evidence of a prior hemorrhoidectomy in the distal rectum. A staple was still present. - External and internal hemorrhoids were found during retroflexion and during digital exam. The hemorrhoids were small.  1. Surgical [P], transverse, descending, polyp (3) - TUBULAR ADENOMA. - NO HIGH GRADE DYSPLASIA OR MALIGNANCY. - SESSILE SERRATED POLYP(S) WITHOUT CYTOLOGIC DYSPLASIA. 2. Surgical [P], anal canal BX - SQUAMOUS MUCOSA WITH KOILOCYTIC CHANGE AND ATYPIA CONSISTENT WITH AIN-I/II. - NO STROMA PRESENT TO  EVALUATE INVASION.  ANAL CANAL, LEFT ANTERIOR, BIOPSY:  - High grade squamous intraepithelial lesion, AIN-II (moderate  dysplasia).   Past Medical History:  Diagnosis Date  . AIN (anal intraepithelial neoplasia) anal canal   . Depression   . GERD (gastroesophageal reflux disease)   . Hemorrhoids   . Hiatal hernia   . History of adenomatous polyp of colon   . History of anal fissures   . Wears contact lenses   . Wears hearing aid in both ears     Current Medications, Allergies, Past Surgical History, Family History and Social History were reviewed in [employer] record.   Current Outpatient Medications  Medication Sig Dispense Refill  . Calcium Carbonate 500 MG CHEW Chew by mouth as needed.     . pantoprazole (PROTONIX) 40 MG tablet Take 40 mg by mouth daily.    . sertraline (ZOLOFT) 50 MG tablet Take 50 mg by mouth daily.      No current facility-administered medications for this visit.    Review of Systems: No weight loss.  No shortness of breath. No  urinary complaints.   PHYSICAL EXAM :    Wt Readings from Last 3 Encounters:  08/17/20 131 lb (59.4 kg)  10/14/19 130 lb (59 kg)  05/28/19 131 lb (59.4 kg)    BP 90/60   Pulse (!) 52   Ht 5\' 6"  (1.676 m)   Wt 131 lb (59.4 kg)   BMI 21.14 kg/m  Constitutional:  Pleasant female in no acute distress. Psychiatric: Normal mood and affect. Behavior is normal. EENT: Pupils normal.  Conjunctivae are normal. No scleral icterus. Neck supple.  Cardiovascular: Normal rate, regular rhythm. No edema Pulmonary/chest: Effort normal and breath sounds normal. No wheezing, rales or rhonchi. Abdominal: Soft, nondistended, nontender. Bowel sounds active throughout. There are no masses palpable. No hepatomegaly. Musculoskeletal: no pain / tenderness of right lateral rib cage was illicite on exam. No masses appreciated. No skin lesions appreciated.  Neurological: Alert and oriented to person place and time. Skin:  Skin is warm and dry. No rashes noted.  Tye Savoy, NP  08/17/2020, 11:01 AM  Cc:  Normand Sloop, MD

## 2020-08-23 ENCOUNTER — Ambulatory Visit (AMBULATORY_SURGERY_CENTER): Payer: BC Managed Care – PPO | Admitting: Internal Medicine

## 2020-08-23 ENCOUNTER — Other Ambulatory Visit: Payer: Self-pay

## 2020-08-23 ENCOUNTER — Encounter: Payer: Self-pay | Admitting: Internal Medicine

## 2020-08-23 ENCOUNTER — Other Ambulatory Visit (INDEPENDENT_AMBULATORY_CARE_PROVIDER_SITE_OTHER): Payer: BC Managed Care – PPO

## 2020-08-23 VITALS — BP 115/70 | HR 51 | Temp 97.4°F | Resp 16 | Ht 66.0 in | Wt 131.0 lb

## 2020-08-23 DIAGNOSIS — R1013 Epigastric pain: Secondary | ICD-10-CM | POA: Diagnosis present

## 2020-08-23 DIAGNOSIS — K317 Polyp of stomach and duodenum: Secondary | ICD-10-CM | POA: Diagnosis not present

## 2020-08-23 DIAGNOSIS — K449 Diaphragmatic hernia without obstruction or gangrene: Secondary | ICD-10-CM | POA: Diagnosis not present

## 2020-08-23 DIAGNOSIS — K319 Disease of stomach and duodenum, unspecified: Secondary | ICD-10-CM

## 2020-08-23 DIAGNOSIS — K219 Gastro-esophageal reflux disease without esophagitis: Secondary | ICD-10-CM

## 2020-08-23 DIAGNOSIS — K222 Esophageal obstruction: Secondary | ICD-10-CM

## 2020-08-23 DIAGNOSIS — K3189 Other diseases of stomach and duodenum: Secondary | ICD-10-CM | POA: Diagnosis not present

## 2020-08-23 LAB — BUN: BUN: 13 mg/dL (ref 6–23)

## 2020-08-23 LAB — CREATININE, SERUM: Creatinine, Ser: 0.9 mg/dL (ref 0.40–1.20)

## 2020-08-23 MED ORDER — SODIUM CHLORIDE 0.9 % IV SOLN: 500.0000 mL | Freq: Once | INTRAVENOUS | Status: AC

## 2020-08-23 NOTE — Progress Notes (Signed)
Vs by Alvester Chou RN

## 2020-08-23 NOTE — Progress Notes (Signed)
Report to PACU, RN, vss, BBS= Clear.  

## 2020-08-23 NOTE — Op Note (Signed)
Center Patient Name: Joan Bailey Procedure Date: 08/23/2020 11:06 AM MRN: [id number] Endoscopist: Jerene Bears , MD Age: 60 Referring MD:  Date of Birth: 08/15/1960 Gender: Female Account #: [id number] Procedure:                Upper GI endoscopy Indications:              Epigastric abdominal pain, Gastro-esophageal reflux                            disease previously controlled on daily omeprazole                            40 mg and recently less well controlled (changed to                            pantoprazole 40 mg several months ago) Medicines:                Monitored Anesthesia Care Procedure:                Pre-Anesthesia Assessment:                           - Prior to the procedure, a History and Physical                            was performed, and patient medications and                            allergies were reviewed. The patient's tolerance of                            previous anesthesia was also reviewed. The risks                            and benefits of the procedure and the sedation                            options and risks were discussed with the patient.                            All questions were answered, and informed consent                            was obtained. Prior Anticoagulants: The patient has                            taken no previous anticoagulant or antiplatelet                            agents. ASA Grade Assessment: II - A patient with                            mild systemic disease. After reviewing the risks  and benefits, the patient was deemed in                            satisfactory condition to undergo the procedure.                           After obtaining informed consent, the endoscope was                            passed under direct vision. Throughout the                            procedure, the patient's blood pressure, pulse, and                            oxygen  saturations were monitored continuously. The                            Endoscope was introduced through the mouth, and                            advanced to the operative stoma of duodenum. The                            upper GI endoscopy was accomplished without                            difficulty. The patient tolerated the procedure                            well. Scope In: Scope Out: Findings:                 The Z-line was irregular and was found 36 cm from                            the incisors. Biopsies were taken with a cold                            forceps for histology to exclude Barrett's                            esophagus (if present C0M1).                           A non-obstructing and partial Schatzki's ring was                            found at the gastroesophageal junction.                           A 2-3 cm hiatal hernia was present.                           The gastroesophageal flap valve was visualized  endoscopically and classified as Hill Grade IV (no                            fold, wide open lumen, hiatal hernia present).                           Multiple small sessile polyps were found in the                            gastric fundus and in the gastric body. These are                            consistent with benign fundic gland polyps.                           The exam of the stomach was otherwise normal.                           Biopsies were taken with a cold forceps in the                            gastric body, at the incisura and in the gastric                            antrum for histology and Helicobacter pylori                            testing given epigastric pain.                           The examined duodenum was normal. Complications:            No immediate complications. Estimated Blood Loss:     Estimated blood loss was minimal. Impression:               - Z-line irregular, 36 cm from the incisors.                             Biopsied to exclude Barrett's esophagus.                           - Non-obstructing Schatzki ring.                           - 2 to 3 cm hiatal hernia.                           - Gastroesophageal flap valve classified as Hill                            Grade IV (no fold, wide open lumen, hiatal hernia                            present).                           -  Multiple gastric polyps. Benign.                           - Normal examined duodenum.                           - Gastric biopsies were taken with a cold forceps                            for histology and Helicobacter pylori testing. Recommendation:           - Patient has a contact number available for                            emergencies. The signs and symptoms of potential                            delayed complications were discussed with the                            patient. Return to normal activities tomorrow.                            Written discharge instructions were provided to the                            patient.                           - Resume previous diet.                           - Continue present medications. Given uncontrolled                            GERD and dyspeptic symptoms, increase pantoprazole                            to 40 mg twice daily before 1st and last meal of                            the day x 4 weeks. If symptoms come under complete                            control after 4 weeks, then return to once daily                            dosing.                           - Await pathology results.                           - MRI abd pending for tomorrow to further  characterize pancreatic cystic lesion seen by                            ultrasound. Jerene Bears, MD 08/23/2020 11:27:46 AM This report has been signed electronically.

## 2020-08-23 NOTE — Patient Instructions (Addendum)
Take your stomach medicine twice daily 1/2 hour before breakfast and dinner twice a day for 1 month.  Then, go back to one per day..  Read all of the handouts given to you by your recovery room nurse.  YOU HAD AN ENDOSCOPIC PROCEDURE TODAY AT [facility]:   Refer to the procedure report that was given to you for any specific questions about what was found during the examination.  If the procedure report does not answer your questions, please call your gastroenterologist to clarify.  If you requested that your care partner not be given the details of your procedure findings, then the procedure report has been included in a sealed envelope for you to review at your convenience later.  YOU SHOULD EXPECT: Some feelings of bloating in the abdomen. Passage of more gas than usual.  Walking can help get rid of the air that was put into your GI tract during the procedure and reduce the bloating.   Please Note:  You might notice some irritation and congestion in your nose or some drainage.  This is from the oxygen used during your procedure.  There is no need for concern and it should clear up in a day or so.  SYMPTOMS TO REPORT IMMEDIATELY:    Following upper endoscopy (EGD)  Vomiting of blood or coffee ground material  New chest pain or pain under the shoulder blades  Painful or persistently difficult swallowing  New shortness of breath  Fever of 100F or higher  Black, tarry-looking stools  For urgent or emergent issues, a gastroenterologist can be reached at any hour by calling [phone]. Do not use MyChart messaging for urgent concerns.    DIET:  We do recommend a small meal at first, but then you may proceed to your regular diet.  Drink plenty of fluids but you should avoid alcoholic beverages for 24 hours.  ACTIVITY:  You should plan to take it easy for the rest of today and you should NOT DRIVE or use heavy machinery until tomorrow (because of the sedation medicines used  during the test).    FOLLOW UP: Our staff will call the number listed on your records 48-72 hours following your procedure to check on you and address any questions or concerns that you may have regarding the information given to you following your procedure. If we do not reach you, we will leave a message.  We will attempt to reach you two times.  During this call, we will ask if you have developed any symptoms of COVID 19. If you develop any symptoms (ie: fever, flu-like symptoms, shortness of breath, cough etc.) before then, please call [phone].  If you test positive for Covid 19 in the 2 weeks post procedure, please call and report this information to [place].    If any biopsies were taken you will be contacted by phone or by letter within the next 1-3 weeks.  Please call us at [phone] if you have not heard about the biopsies in 3 weeks.    SIGNATURES/CONFIDENTIALITY: You and/or your care partner have signed paperwork which will be entered into your electronic medical record.  These signatures attest to the fact that that the information above on your After Visit Summary has been reviewed and is understood.  Full responsibility of the confidentiality of this discharge information lies with you and/or your care-partner.

## 2020-08-23 NOTE — Progress Notes (Signed)
Called to room to assist during endoscopic procedure.  Patient ID and intended procedure confirmed with present staff. Received instructions for my participation in the procedure from the performing physician.  

## 2020-08-23 NOTE — Progress Notes (Signed)
Patient to lab before discharge.

## 2020-08-24 ENCOUNTER — Other Ambulatory Visit: Payer: Self-pay | Admitting: Nurse Practitioner

## 2020-08-24 ENCOUNTER — Ambulatory Visit (HOSPITAL_COMMUNITY)
Admission: RE | Admit: 2020-08-24 | Discharge: 2020-08-24 | Disposition: A | Discharge status: Home / Self Care | Payer: BC Managed Care – PPO | Source: Ambulatory Visit | Attending: Nurse Practitioner | Admitting: Nurse Practitioner

## 2020-08-24 DIAGNOSIS — K862 Cyst of pancreas: Secondary | ICD-10-CM | POA: Diagnosis not present

## 2020-08-24 MED ORDER — GADOBUTROL 1 MMOL/ML IV SOLN
6.0000 mL | Freq: Once | INTRAVENOUS | Status: AC | PRN
  Administered 2020-08-24: 6 mL via INTRAVENOUS

## 2020-08-25 ENCOUNTER — Telehealth: Payer: Self-pay

## 2020-08-25 NOTE — Telephone Encounter (Signed)
  Follow up Call-  Call back number 08/23/2020 05/28/2019  Post procedure Call Back phone  # [phone]  Permission to leave phone message No Yes     Patient questions:  Do you have a fever, pain , or abdominal swelling? No. Pain Score  0 *  Have you tolerated food without any problems? Yes.    Have you been able to return to your normal activities? Yes.    Do you have any questions about your discharge instructions: Diet   No. Medications  No. Follow up visit  No.  Do you have questions or concerns about your Care? No.  Actions: * If pain score is 4 or above: No action needed, pain <4.  1. Have you developed a fever since your procedure? no  2.   Have you had an respiratory symptoms (SOB or cough) since your procedure? no  3.   Have you tested positive for COVID 19 since your procedure no  4.   Have you had any family members/close contacts diagnosed with the COVID 19 since your procedure?  no   If yes to any of these questions please route to Joylene John, RN and Joella Prince, RN

## 2020-08-31 ENCOUNTER — Encounter: Payer: Self-pay | Admitting: Internal Medicine

## 2021-02-05 ENCOUNTER — Other Ambulatory Visit: Payer: Self-pay

## 2021-02-05 ENCOUNTER — Telehealth: Payer: Self-pay | Admitting: Internal Medicine

## 2021-02-05 DIAGNOSIS — K862 Cyst of pancreas: Secondary | ICD-10-CM

## 2021-02-05 NOTE — Telephone Encounter (Signed)
Spoke with pt and let her know that she is due for repeat MRI, order in epic and pt knows to expect a phone call from rad scheduling to schedule the appt.

## 2021-02-05 NOTE — Telephone Encounter (Signed)
Pt wants to know if she needs to repeat MRI. She stated that she had one last January. Pls call her.

## 2021-02-14 ENCOUNTER — Ambulatory Visit (HOSPITAL_COMMUNITY): Admission: RE | Admit: 2021-02-14 | Payer: BC Managed Care – PPO | Source: Ambulatory Visit

## 2021-02-14 ENCOUNTER — Other Ambulatory Visit: Payer: Self-pay | Admitting: Internal Medicine

## 2021-02-14 DIAGNOSIS — K862 Cyst of pancreas: Secondary | ICD-10-CM

## 2021-02-21 ENCOUNTER — Ambulatory Visit (HOSPITAL_COMMUNITY)
Admission: RE | Admit: 2021-02-21 | Discharge: 2021-02-21 | Disposition: A | Discharge status: Home / Self Care | Payer: BC Managed Care – PPO | Source: Ambulatory Visit | Attending: Internal Medicine | Admitting: Internal Medicine

## 2021-02-21 ENCOUNTER — Other Ambulatory Visit: Payer: Self-pay

## 2021-02-21 DIAGNOSIS — K862 Cyst of pancreas: Secondary | ICD-10-CM | POA: Diagnosis present

## 2021-02-21 MED ORDER — GADOBUTROL 1 MMOL/ML IV SOLN
6.0000 mL | Freq: Once | INTRAVENOUS | Status: AC | PRN
  Administered 2021-02-21: 6 mL via INTRAVENOUS

## 2021-04-18 IMAGING — MR MR 3D RECON AT [facility]
19 of 21 series · 45 of 48 positions shown · IV contrast (gadavist)
Comparison: None.

CLINICAL DATA: Pancreatic cystic lesion and possible gallbladder
polyp on recent outside ultrasound.

EXAM:
MRI ABDOMEN WITHOUT AND WITH CONTRAST (INCLUDING MRCP)
TECHNIQUE: Multiplanar multisequence MR imaging of the abdomen was performed
both before and after the administration of intravenous contrast.
Heavily T2-weighted images of the biliary and pancreatic ducts were
obtained, and three-dimensional MRCP images were rendered by post
processing.
CONTRAST:  6mL GADAVIST GADOBUTROL 1 MMOL/ML IV SOLN

[Series 3: T2 fat-sat · axial · 6.0mm · 1.14mm/px · 1 of 36 slices shown]
[im 1/36]
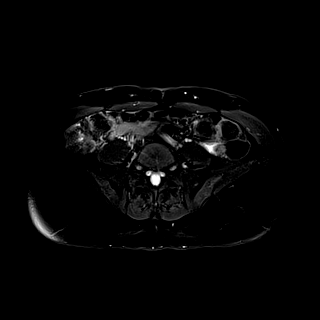

[Series 6: cor_3d_spc_trig · coronal · 1.0mm · 0.49mm/px · 2 of 72 slices shown]
[im 1/72]
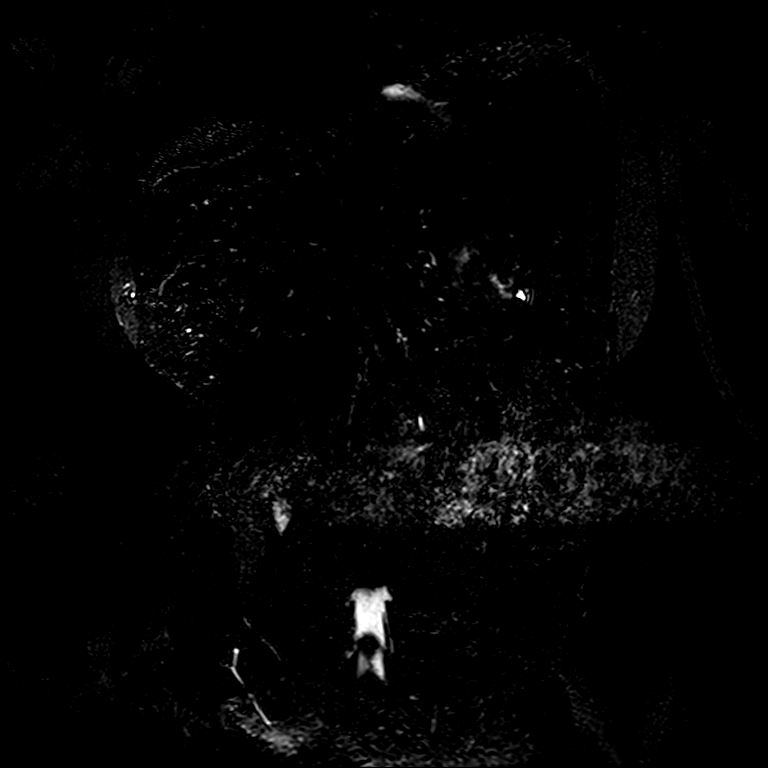
[im 72/72]
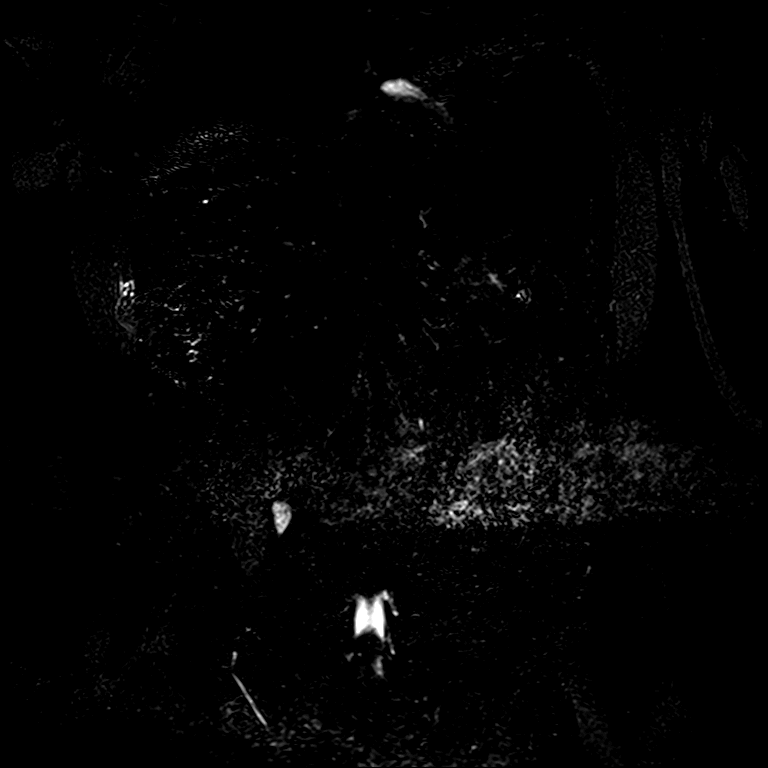

[Series 8: DWI · axial · 6.0mm · 1.36mm/px · z∈[-217,+35]mm · 3 of 72 slices shown (1 of 2)]
[im 1/72]
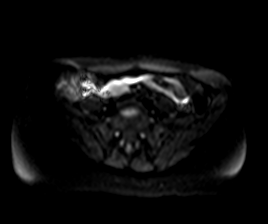
[im 36/72]
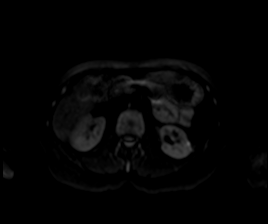
[im 72/72]
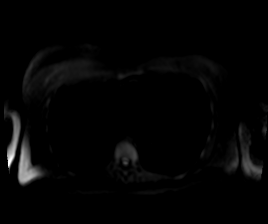

[Series 9: DWI · axial · 6.0mm · 1.36mm/px · 1 of 36 slices shown (2 of 2)]
[im 1/36]
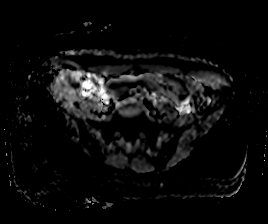

[Series 11: T2 · coronal · 6.0mm · 1.48mm/px · 1 of 27 slices shown (1 of 2)]
[im 1/27]
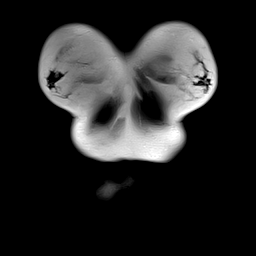

[Series 13: T1 · axial · 3.0mm · 1.16mm/px · z∈[-193,+44]mm · 3 of 80 slices shown (1 of 2)]
[im 1/80]
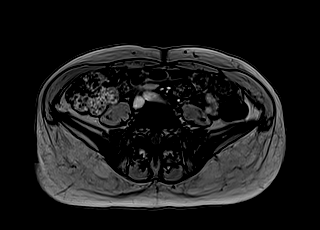
[im 40/80]
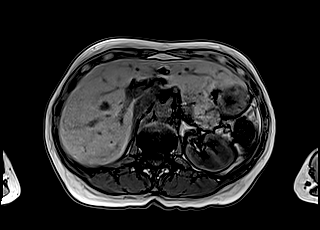
[im 80/80]
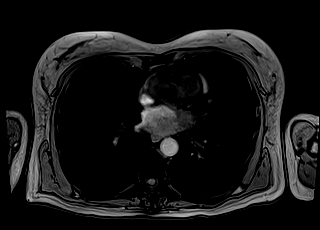

[Series 14: T1 · axial · 3.0mm · 1.16mm/px · z∈[-193,+44]mm · 3 of 80 slices shown (2 of 2)]
[im 1/80]
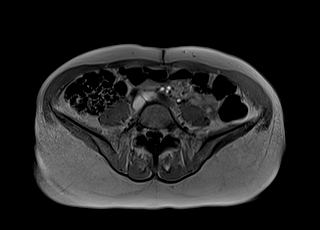
[im 40/80]
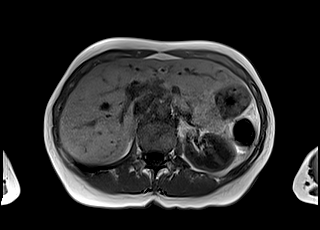
[im 80/80]
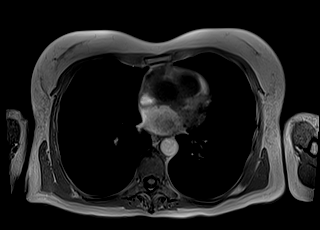

[Series 15: cor obl thk · sagittal · 50.0mm · 0.78mm/px · 1 of 9 slices shown]
[im 1/9]
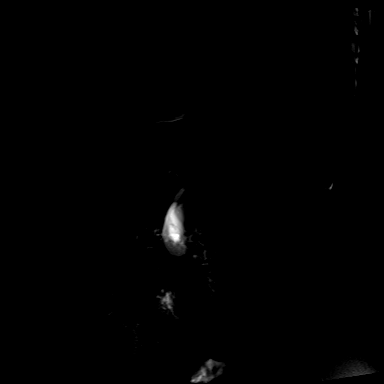

[Series 16: T2 · axial · 6.0mm · 1.45mm/px · 1 of 34 slices shown (2 of 2)]
[im 1/34]
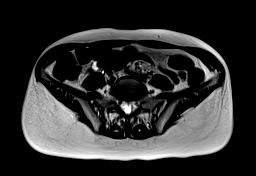

[Series 18: T1 dynamic · axial · 3.0mm · 1.16mm/px · z∈[-207,+30]mm · 3 of 80 slices shown (1 of 6)]
[im 1/80]
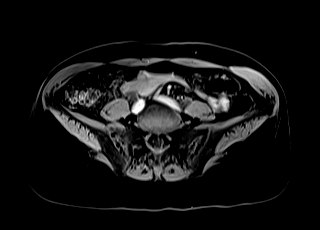
[im 40/80]
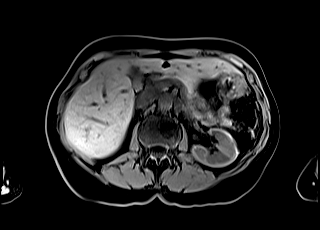
[im 80/80]
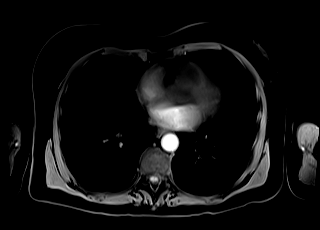

[Series 21: T1 dynamic · axial · 3.0mm · 1.16mm/px · z∈[-207,+30]mm · 3 of 80 slices shown (2 of 6)]
[im 1/80]
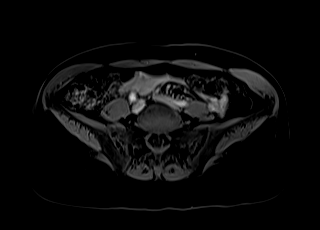
[im 40/80]
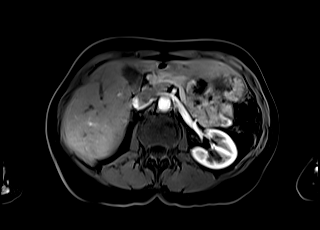
[im 80/80]
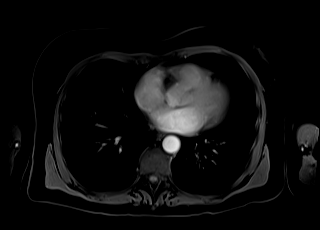

[Series 23: T1 dynamic · axial · 3.0mm · 1.16mm/px · z∈[-207,+30]mm · 3 of 80 slices shown (3 of 6)]
[im 1/80]
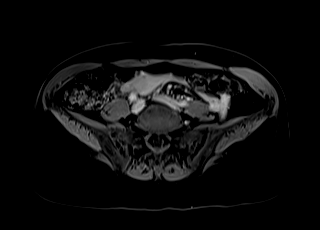
[im 40/80]
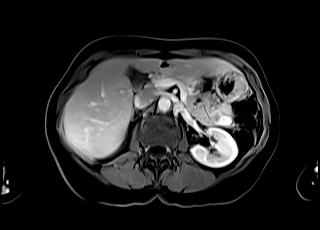
[im 80/80]
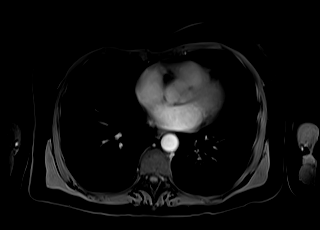

[Series 25: T1 dynamic · axial · 3.0mm · 1.16mm/px · z∈[-207,+30]mm · 3 of 80 slices shown (4 of 6)]
[im 1/80]
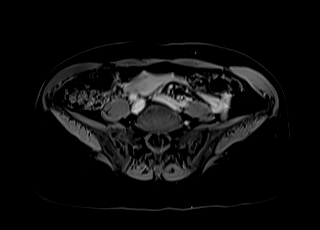
[im 40/80]
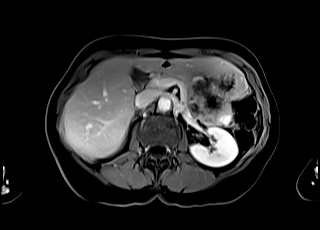
[im 80/80]
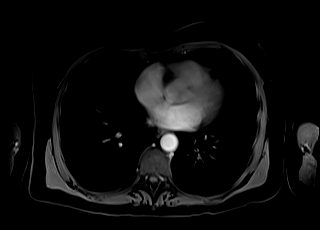

[Series 27: T1 dynamic · coronal · 4.0mm · 1.41mm/px · 2 of 52 slices shown (5 of 6)]
[im 1/52]
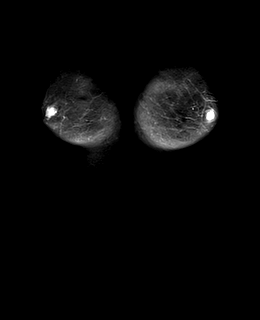
[im 52/52]
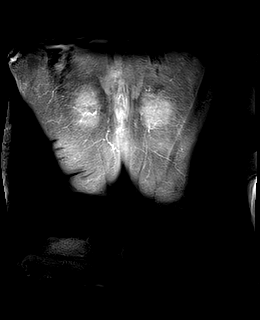

[Series 29: T1 dynamic · axial · 3.0mm · 1.16mm/px · z∈[-207,+30]mm · 3 of 80 slices shown (6 of 6)]
[im 1/80]
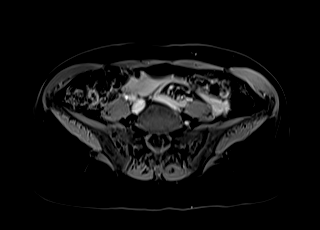
[im 40/80]
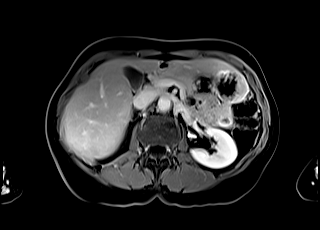
[im 80/80]
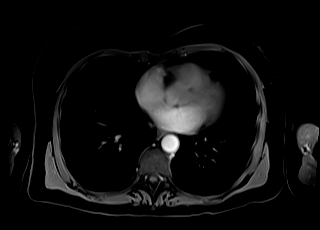

[Series 101: sub 20 sec · axial · 3.0mm · 1.16mm/px · z∈[-207,+30]mm · 3 of 80 slices shown]
[im 1/80]
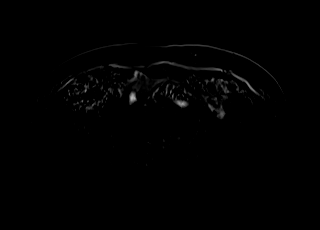
[im 40/80]
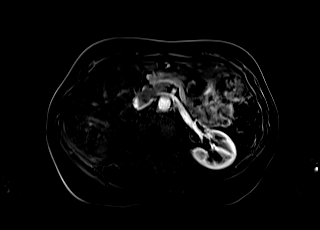
[im 80/80]
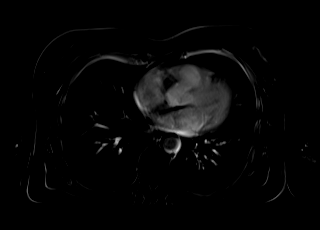

[Series 102: sub 45 sec · axial · 3.0mm · 1.16mm/px · z∈[-207,+30]mm · 3 of 80 slices shown]
[im 1/80]
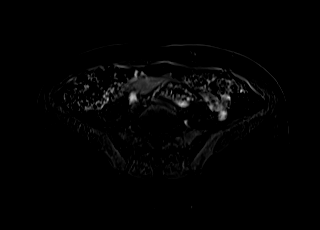
[im 40/80]
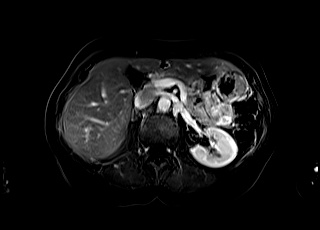
[im 80/80]
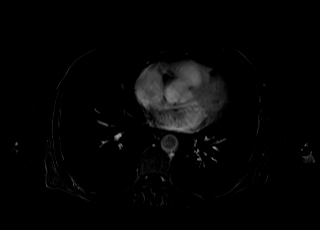

[Series 103: sub 90 sec · axial · 3.0mm · 1.16mm/px · z∈[-207,+30]mm · 3 of 80 slices shown]
[im 1/80]
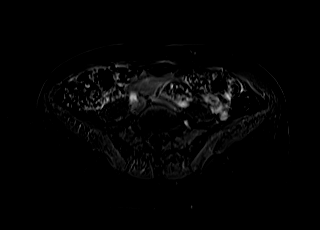
[im 40/80]
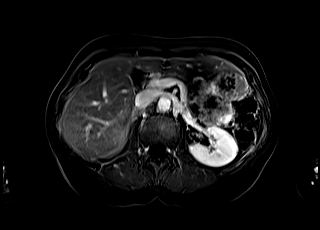
[im 80/80]
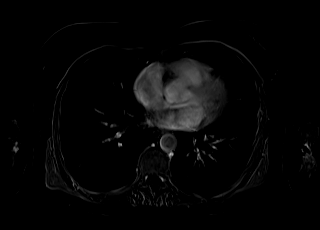

[Series 104: sub 3 min · axial · 3.0mm · 1.16mm/px · z∈[-207,+30]mm · 3 of 80 slices shown]
[im 1/80]
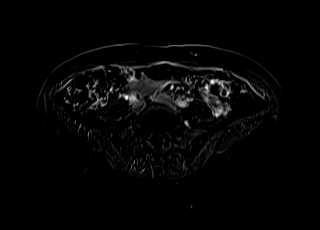
[im 40/80]
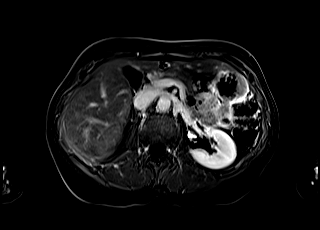
[im 80/80]
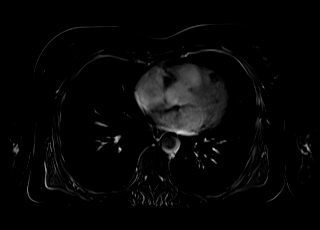

[45 of 48 positions shown; findings below may reference images not displayed]

FINDINGS: Lower chest: No acute findings.

Hepatobiliary: No hepatic masses identified. Gallbladder is
unremarkable. No radiographic evidence of gallbladder nodule or
mass. No evidence of biliary ductal dilatation.

Pancreas: A cystic lesion is seen in the pancreatic head adjacent to
the seen common bile duct which measures 1.6 x 0.8 cm on image 37/6.
This shows no evidence of enhancement, and no other pancreatic
lesions identified. No evidence of pancreatic ductal dilatation.

Spleen:  Within normal limits in size and appearance.

Adrenals/Urinary Tract: No masses identified. No evidence of
hydronephrosis.

Stomach/Bowel: Visualized portion unremarkable.

Vascular/Lymphatic: No pathologically enlarged lymph nodes
identified. No abdominal aortic aneurysm.

Other:  None.

Musculoskeletal:  No suspicious bone lesions identified.
IMPRESSION: 1.6 cm cystic lesion in the pancreatic head, likely representing an
indolent cystic neoplasm such as a side-branch IPMN. Recommend
continued follow-up by MRI in 6 months to confirm stability.

Unremarkable appearance of gallbladder by MRI. No evidence of
biliary ductal dilatation or other significant abnormality.

## 2021-04-18 IMAGING — MR MR ABDOMEN WO/W CM MRCP
19 of 21 series · 45 of 48 positions shown · IV contrast (gadavist)
Comparison: None.

CLINICAL DATA: Pancreatic cystic lesion and possible gallbladder
polyp on recent outside ultrasound.

EXAM:
MRI ABDOMEN WITHOUT AND WITH CONTRAST (INCLUDING MRCP)
TECHNIQUE: Multiplanar multisequence MR imaging of the abdomen was performed
both before and after the administration of intravenous contrast.
Heavily T2-weighted images of the biliary and pancreatic ducts were
obtained, and three-dimensional MRCP images were rendered by post
processing.
CONTRAST:  6mL GADAVIST GADOBUTROL 1 MMOL/ML IV SOLN

[Series 3: T2 fat-sat · axial · 6.0mm · 1.14mm/px · 1 of 36 slices shown]
[im 1/36]
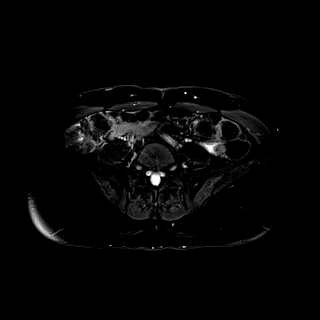

[Series 6: cor_3d_spc_trig · coronal · 1.0mm · 0.49mm/px · 2 of 72 slices shown]
[im 1/72]
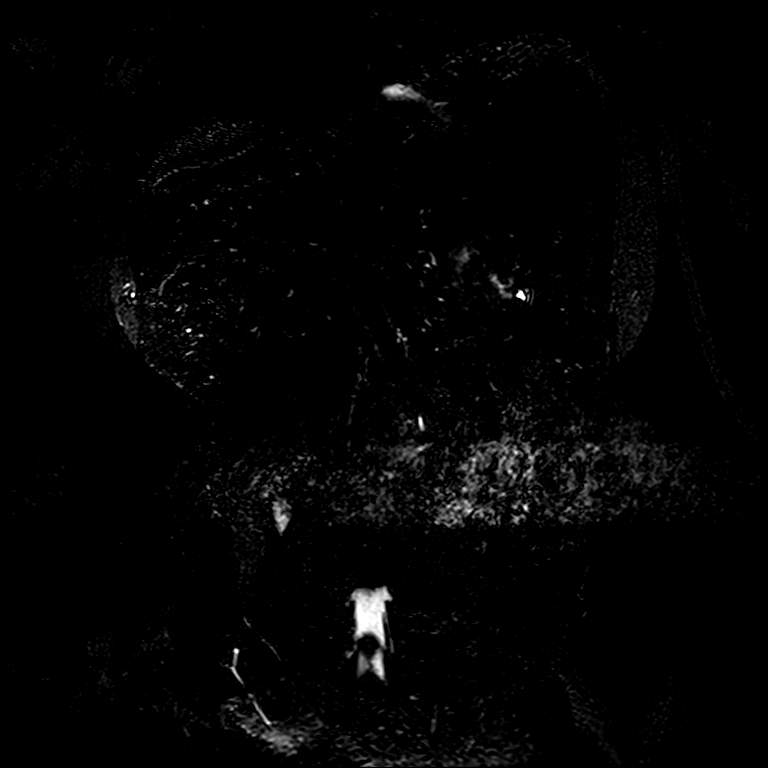
[im 72/72]
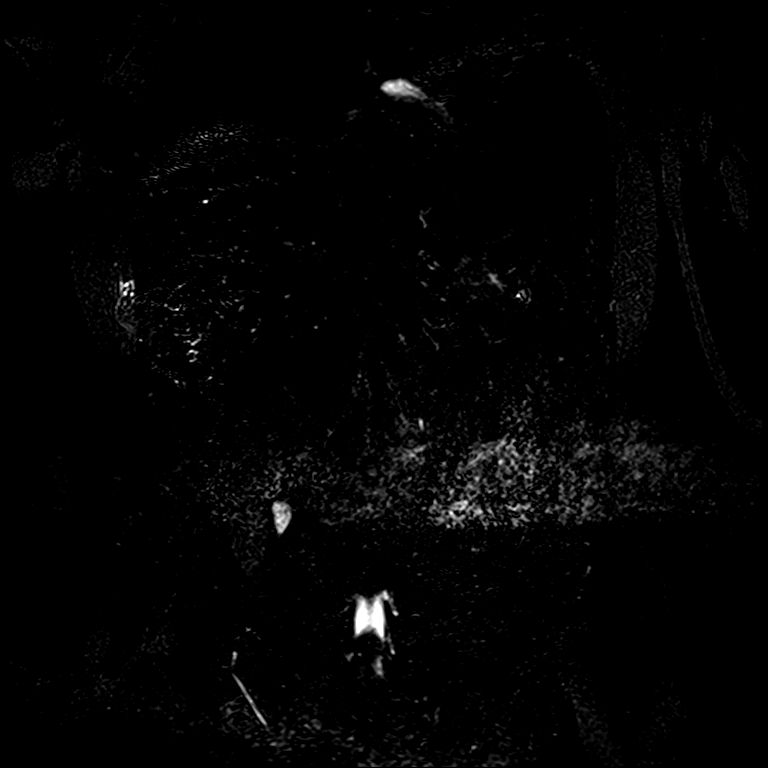

[Series 8: DWI · axial · 6.0mm · 1.36mm/px · z∈[-217,+35]mm · 3 of 72 slices shown (1 of 2)]
[im 1/72]
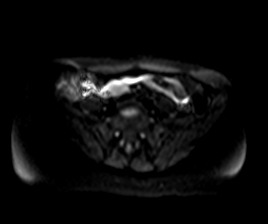
[im 36/72]
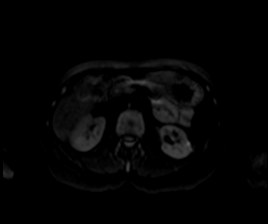
[im 72/72]
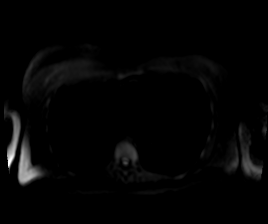

[Series 9: DWI · axial · 6.0mm · 1.36mm/px · 1 of 36 slices shown (2 of 2)]
[im 1/36]
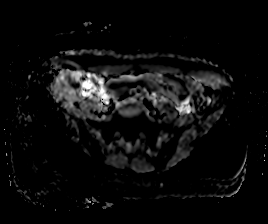

[Series 11: T2 · coronal · 6.0mm · 1.48mm/px · 1 of 27 slices shown (1 of 2)]
[im 1/27]
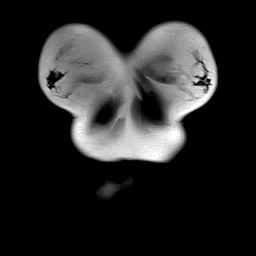

[Series 13: T1 · axial · 3.0mm · 1.16mm/px · z∈[-193,+44]mm · 3 of 80 slices shown (1 of 2)]
[im 1/80]
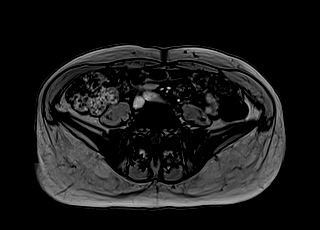
[im 40/80]
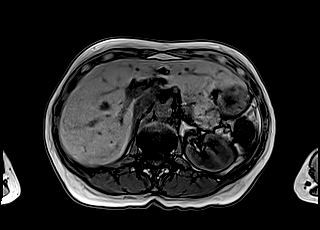
[im 80/80]
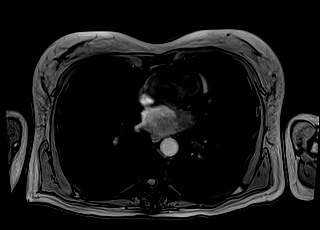

[Series 14: T1 · axial · 3.0mm · 1.16mm/px · z∈[-193,+44]mm · 3 of 80 slices shown (2 of 2)]
[im 1/80]
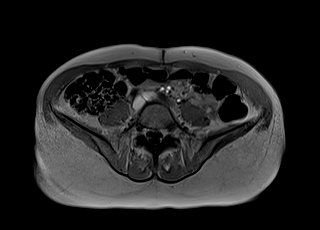
[im 40/80]
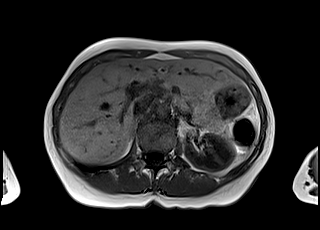
[im 80/80]
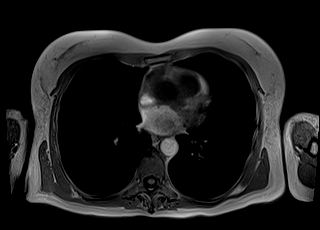

[Series 15: cor obl thk · sagittal · 50.0mm · 0.78mm/px · 1 of 9 slices shown]
[im 1/9]
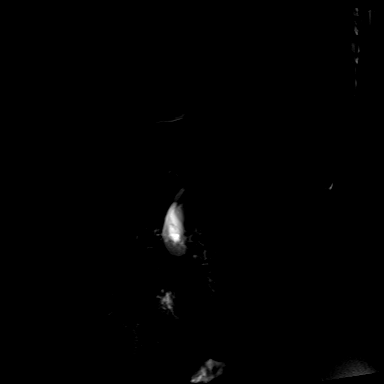

[Series 16: T2 · axial · 6.0mm · 1.45mm/px · 1 of 34 slices shown (2 of 2)]
[im 1/34]
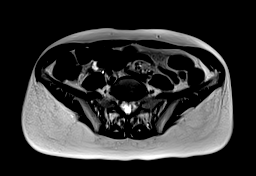

[Series 18: T1 dynamic · axial · 3.0mm · 1.16mm/px · z∈[-207,+30]mm · 3 of 80 slices shown (1 of 6)]
[im 1/80]
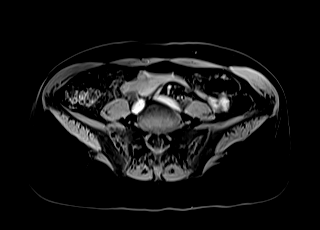
[im 40/80]
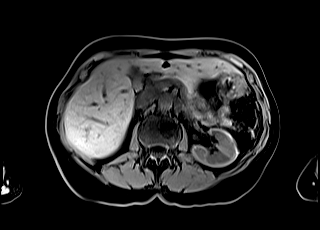
[im 80/80]
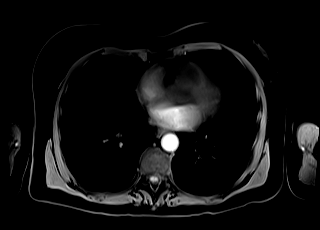

[Series 21: T1 dynamic · axial · 3.0mm · 1.16mm/px · z∈[-207,+30]mm · 3 of 80 slices shown (2 of 6)]
[im 1/80]
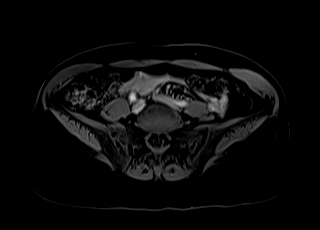
[im 40/80]
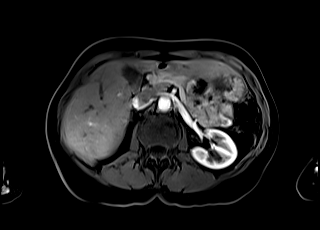
[im 80/80]
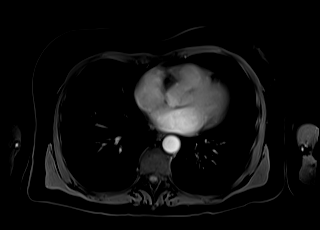

[Series 23: T1 dynamic · axial · 3.0mm · 1.16mm/px · z∈[-207,+30]mm · 3 of 80 slices shown (3 of 6)]
[im 1/80]
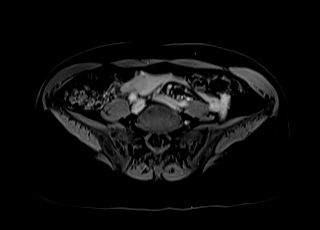
[im 40/80]
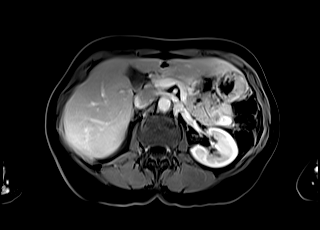
[im 80/80]
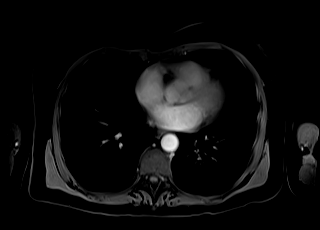

[Series 25: T1 dynamic · axial · 3.0mm · 1.16mm/px · z∈[-207,+30]mm · 3 of 80 slices shown (4 of 6)]
[im 1/80]
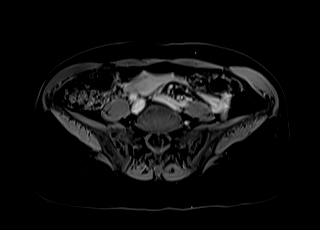
[im 40/80]
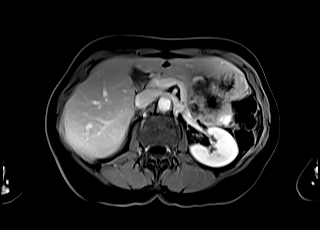
[im 80/80]
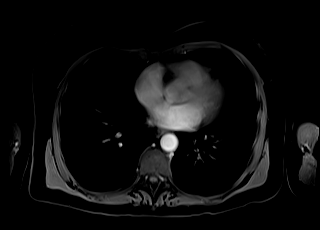

[Series 27: T1 dynamic · coronal · 4.0mm · 1.41mm/px · 2 of 52 slices shown (5 of 6)]
[im 1/52]
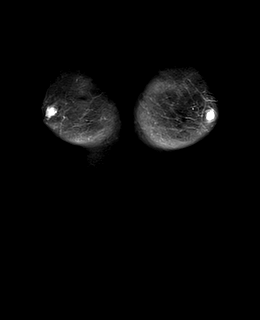
[im 52/52]
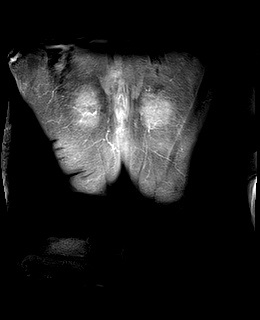

[Series 29: T1 dynamic · axial · 3.0mm · 1.16mm/px · z∈[-207,+30]mm · 3 of 80 slices shown (6 of 6)]
[im 1/80]
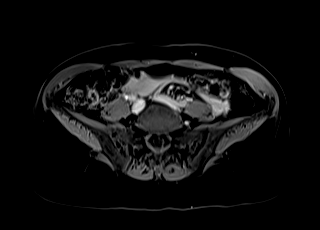
[im 40/80]
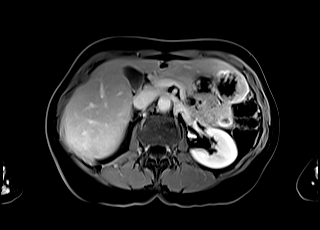
[im 80/80]
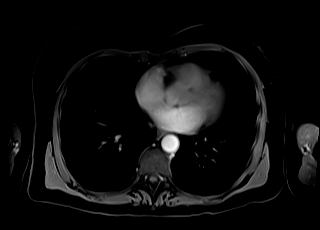

[Series 101: sub 20 sec · axial · 3.0mm · 1.16mm/px · z∈[-207,+30]mm · 3 of 80 slices shown]
[im 1/80]
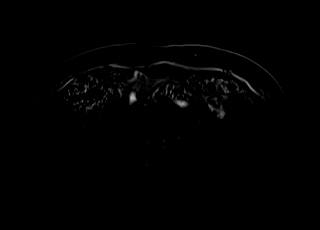
[im 40/80]
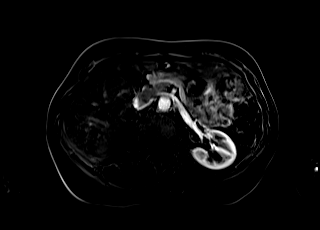
[im 80/80]
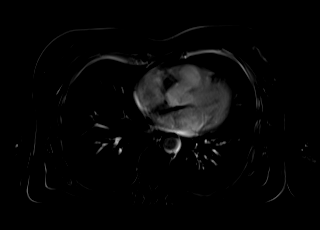

[Series 102: sub 45 sec · axial · 3.0mm · 1.16mm/px · z∈[-207,+30]mm · 3 of 80 slices shown]
[im 1/80]
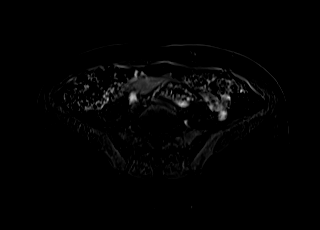
[im 40/80]
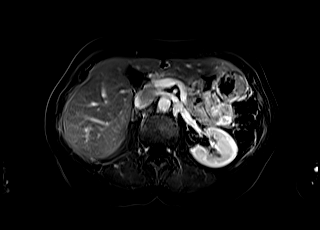
[im 80/80]
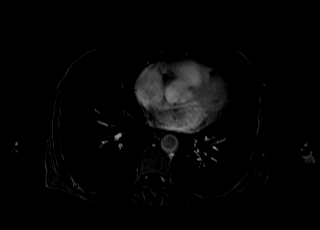

[Series 103: sub 90 sec · axial · 3.0mm · 1.16mm/px · z∈[-207,+30]mm · 3 of 80 slices shown]
[im 1/80]
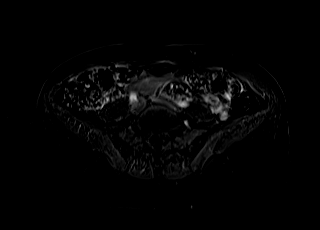
[im 40/80]
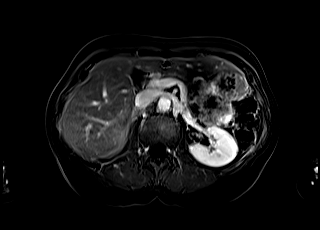
[im 80/80]
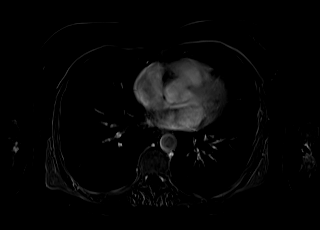

[Series 104: sub 3 min · axial · 3.0mm · 1.16mm/px · z∈[-207,+30]mm · 3 of 80 slices shown]
[im 1/80]
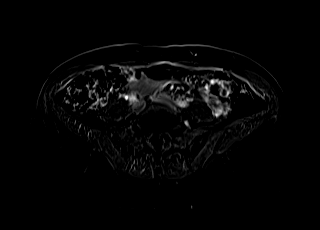
[im 40/80]
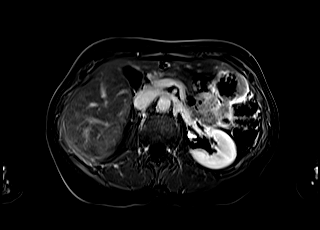
[im 80/80]
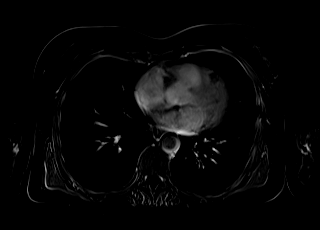

[45 of 48 positions shown; findings below may reference images not displayed]

FINDINGS: Lower chest: No acute findings.

Hepatobiliary: No hepatic masses identified. Gallbladder is
unremarkable. No radiographic evidence of gallbladder nodule or
mass. No evidence of biliary ductal dilatation.

Pancreas: A cystic lesion is seen in the pancreatic head adjacent to
the seen common bile duct which measures 1.6 x 0.8 cm on image 37/6.
This shows no evidence of enhancement, and no other pancreatic
lesions identified. No evidence of pancreatic ductal dilatation.

Spleen:  Within normal limits in size and appearance.

Adrenals/Urinary Tract: No masses identified. No evidence of
hydronephrosis.

Stomach/Bowel: Visualized portion unremarkable.

Vascular/Lymphatic: No pathologically enlarged lymph nodes
identified. No abdominal aortic aneurysm.

Other:  None.

Musculoskeletal:  No suspicious bone lesions identified.
IMPRESSION: 1.6 cm cystic lesion in the pancreatic head, likely representing an
indolent cystic neoplasm such as a side-branch IPMN. Recommend
continued follow-up by MRI in 6 months to confirm stability.

Unremarkable appearance of gallbladder by MRI. No evidence of
biliary ductal dilatation or other significant abnormality.

## 2022-02-25 ENCOUNTER — Telehealth: Payer: Self-pay

## 2022-02-25 ENCOUNTER — Other Ambulatory Visit: Payer: Self-pay

## 2022-02-25 DIAGNOSIS — K862 Cyst of pancreas: Secondary | ICD-10-CM

## 2022-02-25 NOTE — Telephone Encounter (Signed)
Order in epic, rad scheduling to contact pt regarding appt. 

## 2022-02-25 NOTE — Telephone Encounter (Signed)
-----   Message from Algernon Huxley, RN sent at 02/22/2021  1:18 PM EDT ----- Regarding: MR Repeat MRI/MCRP with and without contrast, pancreas protocol in 1 year  Follow-up pancreatic cystic lesion

## 2022-03-01 ENCOUNTER — Telehealth: Payer: Self-pay

## 2022-03-01 NOTE — Telephone Encounter (Signed)
Will await further communication from pt.

## 2022-03-01 NOTE — Telephone Encounter (Signed)
-----   Message from Roosvelt Maser sent at 02/28/2022 11:18 AM EDT ----- Regarding: FW: MR/MRCP W W/O  ----- Message ----- From: Maryann Conners, NT Sent: 02/28/2022  11:14 AM EDT To: Roosvelt Maser; Maryann Conners, NT Subject: RE: MR/MRCP W W/O                              FYI..   02/28/22- Pt stated that her INS changed and that her appts has to be done in [place]. So she's going to contact the ordering Provider to see if he could refer her to a Provider eslewhere.Marland Kitchen  [place]  ----- Message ----- From: Belva Chimes H Sent: 02/25/2022   5:02 PM EDT To: Maryann Conners, NT Subject: FW: MR/MRCP W W/O                               ----- Message ----- From: Algernon Huxley, RN Sent: 02/25/2022   4:19 PM EDT To: April H Pait; Roosvelt Maser Subject: MR/MRCP W W/O                                  Please schedule pt for MR/MRCP

## 2022-03-04 NOTE — Telephone Encounter (Signed)
Discussed with pt that the MRI was ordered for [facility]. She will see if they are in network. If so, pt will call [phone] to schedule the scan.

## 2022-03-04 NOTE — Telephone Encounter (Signed)
Patient called back stating that she spoke with insurance and they told her that she did not have to stay in [place] for test. Patient called back wanting to know where she would be having test done so she can call insurance to make sure it is in network. Please advise.

## 2022-03-04 NOTE — Telephone Encounter (Signed)
PT is calling to let us know she spoke with her insurance company and [facility] is not within her network and that she needs a referral to a doctor in or near [place], [place] for the procedure itself. Please reach out to advise. Thank you.

## 2022-03-04 NOTE — Telephone Encounter (Signed)
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

## 2022-03-04 NOTE — Telephone Encounter (Signed)
Pt does not want to transfer care just wants scan done in [place]. She would like [place] to order the scan at [facility]. Phone number [phone].

## 2022-03-04 NOTE — Telephone Encounter (Signed)
See note below and advise. 

## 2022-03-04 NOTE — Telephone Encounter (Signed)
Patient called would like to know if Dr. Hilarie Fredrickson can recommend her to someone where she can have the test done in [place].

## 2022-03-04 NOTE — Telephone Encounter (Signed)
See note below. Pt is due for MR/MRCP w w/o and her insurance will only approve for it to be done in [place]. Pt wants to know if Dr. Hilarie Fredrickson can refer her to a provider in [place] that can take over and order the scan she needs.

## 2022-03-05 NOTE — Telephone Encounter (Signed)
Spoke with pt and she is aware of appt and instructions. Requested facility fax results to [phone].

## 2022-03-05 NOTE — Telephone Encounter (Signed)
Pt scheduled in [place] 03/19/22 at 11am, pt to arrive there at 10:30am. Pt to be NPO 4 hours prior to scan. Orders faxed to facility at [phone]. Information sent to [facility] to obtain PA. Left message for pt to call back.

## 2022-03-28 ENCOUNTER — Telehealth: Payer: Self-pay | Admitting: Internal Medicine

## 2022-03-28 NOTE — Telephone Encounter (Signed)
Pt called requesting a call back.

## 2022-03-28 NOTE — Telephone Encounter (Signed)
Pt states that last years MRI stated cyst was 1.7cm, this years scan states cyst is 83m. Pt concerned. Discussed with her if you convert the mm to cm the measurements are the same. Pt verbalized understanding and confirmed that Dr. PHilarie Fredricksonwants to repeat the imaging in 12 mth.

## 2023-04-02 ENCOUNTER — Telehealth: Payer: Self-pay | Admitting: *Deleted

## 2023-04-02 ENCOUNTER — Other Ambulatory Visit: Payer: Self-pay | Admitting: *Deleted

## 2023-04-02 DIAGNOSIS — K862 Cyst of pancreas: Secondary | ICD-10-CM

## 2023-04-02 NOTE — Telephone Encounter (Signed)
Patient called to inform of the repeat MRI of the ABD. Informed the patient that imaging will be notifying her of the scheduled appt. Patient agreed.

## 2023-04-02 NOTE — Telephone Encounter (Signed)
-----   Message from Nurse Kerrie Buffalo sent at 04/01/2023  4:01 PM EDT ----- Regarding: FW: Mr/MRCP  ----- Message ----- From: Chrystie Nose, RN Sent: 03/28/2023  12:00 AM EDT To: Chrystie Nose, RN Subject: Mr/MRCP                                        Pt needs repeat scan in 1 year.

## 2023-04-07 ENCOUNTER — Telehealth: Payer: Self-pay | Admitting: Internal Medicine

## 2023-04-07 NOTE — Telephone Encounter (Signed)
Inbound call from patient stating she needs to change location of MRI. Patient states [facility] is out of network with her insurance. States she would like to have in scheduled in [place] at a facility that is within her network. Patient requesting a call back. Please advise, thank you.

## 2023-04-08 ENCOUNTER — Telehealth: Payer: Self-pay | Admitting: *Deleted

## 2023-04-08 ENCOUNTER — Other Ambulatory Visit: Payer: Self-pay | Admitting: *Deleted

## 2023-04-08 DIAGNOSIS — K862 Cyst of pancreas: Secondary | ICD-10-CM

## 2023-04-08 NOTE — Telephone Encounter (Signed)
Called patient to verify she is wanting to change locations for the  MRI ABD w/wo contrast and MRCP. Patient confirmed that her insurance will not pay for the imaging to be done at [facility] hospital. Patient informed the nurse of the facility the insurance will pay to have the procedure which is [facility] and the phone number is [phone]. Nurse called this number to receive the fax information to send the order for the procedure to be done. Orders faxed to [phone].

## 2023-04-08 NOTE — Telephone Encounter (Signed)
Called patient to notify the order has been faxed to [facility] and it's ok to call and schedule an appt with that facility. Patient understood and agreed.

## 2023-04-10 ENCOUNTER — Ambulatory Visit (HOSPITAL_COMMUNITY): Payer: BC Managed Care – PPO

## 2023-04-11 NOTE — Telephone Encounter (Signed)
Inbound call from patient f/u on pre cert. Please advises.

## 2023-04-16 NOTE — Telephone Encounter (Signed)
Claris Gower from [facility] called stating they are going to leave patient on for tomorrow. Insurance states they are out of network and they are not. Claris Gower stated if anything changes she will call and inform us.

## 2023-04-16 NOTE — Telephone Encounter (Addendum)
Inbound call from [facility], calling for an update on Pre-cert. Patient is scheduled for tomorrow morning, stated they would have to reschedule patient out a couple days in order to get the Auth code, since they have not received the approval yet.   Claris Gower [phone]. Secure voicemail.

## 2023-04-23 ENCOUNTER — Telehealth: Payer: Self-pay

## 2023-04-23 NOTE — Telephone Encounter (Signed)
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 There is no definitive communication with the main pancreatic duct. No definitive post contrast enhancement.   Dr. 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 Also, have patient repeat MRI abd with contrast and MRCP in 2 years.  Patient notified of MRI results and she verbalized understanding. Reminder for MRI/MRCP placed in Epic to contact patient.

## 2023-04-23 NOTE — Telephone Encounter (Signed)
Joan Bailey at [facility] was called to verify MRI ABD was done. There was a question as to whether or not the facility was in network. Patient did have the MRI done on 04/18/23 as verified via Joan Bailey.

## 2024-05-03 ENCOUNTER — Ambulatory Visit (AMBULATORY_SURGERY_CENTER)

## 2024-05-03 VITALS — Ht 66.0 in | Wt 130.0 lb

## 2024-05-03 DIAGNOSIS — Z860100 Personal history of colon polyps, unspecified: Secondary | ICD-10-CM

## 2024-05-03 MED ORDER — NA SULFATE-K SULFATE-MG SULF 17.5-3.13-1.6 GM/177ML PO SOLN: 1.0000 | Freq: Once | ORAL | 0 refills | Status: AC

## 2024-05-03 NOTE — Progress Notes (Signed)
 No egg or soy allergy known to patient  No issues known to pt with past sedation with any surgeries or procedures Patient denies ever being told they had issues or difficulty with intubation  No FH of Malignant Hyperthermia Pt is not on diet pills Pt is not on  home 02  Pt is not on blood thinners  Pt denies issues with constipation  No A fib or A flutter Have any cardiac testing pending-- no testing, cardiac apt for bradycardia  LOA: independent  Prep: suprep   Patient's chart reviewed by Norleen Schillings CNRA prior to previsit and patient appropriate for the [facility].  Previsit completed and red dot placed by patient's name on their procedure day (on provider's schedule).     PV completed with patient. Prep instructions sent via mychart and home address.

## 2024-05-10 ENCOUNTER — Encounter: Payer: Self-pay | Admitting: Internal Medicine

## 2024-05-18 ENCOUNTER — Ambulatory Visit (AMBULATORY_SURGERY_CENTER): Admitting: Internal Medicine

## 2024-05-18 ENCOUNTER — Encounter: Payer: Self-pay | Admitting: Internal Medicine

## 2024-05-18 VITALS — BP 109/59 | HR 48 | Temp 97.0°F | Resp 14 | Ht 66.0 in | Wt 130.0 lb

## 2024-05-18 DIAGNOSIS — Z1211 Encounter for screening for malignant neoplasm of colon: Secondary | ICD-10-CM

## 2024-05-18 DIAGNOSIS — K635 Polyp of colon: Secondary | ICD-10-CM

## 2024-05-18 DIAGNOSIS — Z860100 Personal history of colon polyps, unspecified: Secondary | ICD-10-CM

## 2024-05-18 DIAGNOSIS — Z860101 Personal history of adenomatous and serrated colon polyps: Secondary | ICD-10-CM

## 2024-05-18 DIAGNOSIS — D120 Benign neoplasm of cecum: Secondary | ICD-10-CM

## 2024-05-18 DIAGNOSIS — D124 Benign neoplasm of descending colon: Secondary | ICD-10-CM

## 2024-05-18 MED ORDER — SODIUM CHLORIDE 0.9 % IV SOLN
500.0000 mL | Freq: Once | INTRAVENOUS | Status: DC
Start: 2024-05-18 — End: 2024-05-18

## 2024-05-18 NOTE — Progress Notes (Signed)
 GASTROENTEROLOGY PROCEDURE H&P NOTE   Primary Care Physician: Irven Ozell DEL, MD    Reason for Procedure:  History of colonic polyps  Plan:    Colonoscopy  Patient is appropriate for endoscopic procedure(s) in the ambulatory ([facility]) setting.  The nature of the procedure, as well as the risks, benefits, and alternatives were carefully and thoroughly reviewed with the patient. Ample time for discussion and questions allowed. The patient understood, was satisfied, and agreed to proceed.     HPI: Joan Bailey is a 63 y.o. female who presents for colonoscopy.  Medical history as below.  Tolerated the prep.  No recent chest pain or shortness of breath.  No abdominal pain today.  Past Medical History:  Diagnosis Date   AIN (anal intraepithelial neoplasia) anal canal    Depression    GERD (gastroesophageal reflux disease)    Hemorrhoids    Hiatal hernia    History of adenomatous polyp of colon    History of anal fissures    Wears contact lenses    Wears hearing aid in both ears     Past Surgical History:  Procedure Laterality Date   COLONOSCOPY  last one 05-28-2019    dr Amyrah Pinkhasov   DE QUERVAIN'S RELEASE     GANGLION CYST EXCISION Left yrs ago   left foot   HIGH RESOLUTION ANOSCOPY N/A 10/14/2019   Procedure: HIGH RESOLUTION ANOSCOPY,WITH BIOPSY;  Surgeon: Debby Hila, MD;  Location: [facility];  Service: General;  Laterality: N/A;   PERCUTANEOUS PINNING TOE FRACTURE Right 2017   great toe   STAPLE HEMORRHOIDECTOMY  2002    Prior to Admission medications   Medication Sig Start Date End Date Taking? Authorizing Provider  cycloSPORINE (RESTASIS) 0.05 % ophthalmic emulsion Place 1 drop into both eyes 2 (two) times daily as needed.   Yes [provider]  erythromycin ophthalmic ointment Place 1 Application into both eyes as needed.   Yes [provider]  folic acid (FOLVITE) 1 MG tablet Take 1 mg by mouth daily.   Yes [provider]  Magnesium Gluconate 250 MG TABS Take 250 mg by mouth daily. 05/14/22  Yes [provider]  sertraline (ZOLOFT) 50 MG tablet Take 50 mg by mouth daily.  Patient taking differently: Take 25 mg by mouth every 3 (three) days. 04/08/19  Yes [provider]  pantoprazole (PROTONIX) 40 MG tablet Take 40 mg by mouth daily. Patient not taking: No sig reported 08/02/20   [provider]  valACYclovir (VALTREX) 1000 MG tablet Take 2 g by mouth as needed. 12/23/23 12/15/24  [provider]    Current Outpatient Medications  Medication Sig Dispense Refill   cycloSPORINE (RESTASIS) 0.05 % ophthalmic emulsion Place 1 drop into both eyes 2 (two) times daily as needed.     erythromycin ophthalmic ointment Place 1 Application into both eyes as needed.     folic acid (FOLVITE) 1 MG tablet Take 1 mg by mouth daily.     Magnesium Gluconate 250 MG TABS Take 250 mg by mouth daily.     sertraline (ZOLOFT) 50 MG tablet Take 50 mg by mouth daily.  (Patient taking differently: Take 25 mg by mouth every 3 (three) days.)     pantoprazole (PROTONIX) 40 MG tablet Take 40 mg by mouth daily. (Patient not taking: No sig reported)     valACYclovir (VALTREX) 1000 MG tablet Take 2 g by mouth as needed.     Current Facility-Administered Medications  Medication Dose Route Frequency Provider Last Rate Last Admin   0.9 %  sodium chloride  infusion  500 mL Intravenous Once Jacqeline Broers, Gordy HERO, MD       0.9 %  sodium chloride  infusion  500 mL Intravenous Once Latifah Padin, Gordy HERO, MD        Allergies as of 05/18/2024   (No Known Allergies)    Family History  Problem Relation Age of Onset   Stomach cancer Maternal Grandfather    Pancreatic cancer Mother    Colon cancer Neg Hx    Colon polyps Neg Hx    Esophageal cancer Neg Hx    Rectal cancer Neg Hx     Social History   Socioeconomic History   Marital status: Married    Spouse name: Not on file   Number of children: Not on file    Years of education: Not on file   Highest education level: Not on file  Occupational History   Not on file  Tobacco Use   Smoking status: Never   Smokeless tobacco: Never  Vaping Use   Vaping status: Never Used  Substance and Sexual Activity   Alcohol use: Not Currently   Drug use: Never   Sexual activity: Not on file  Other Topics Concern   Not on file  Social History Narrative   Not on file   Social Drivers of Health   Financial Resource Strain: Not on file  Food Insecurity: Not on file  Transportation Needs: Not on file  Physical Activity: Not on file  Stress: Not on file  Social Connections: Not on file  Intimate Partner Violence: Not on file    Physical Exam: Vital signs in last 24 hours: @BP  (!) 100/56   Pulse (!) 50   Temp (!) 97 F (36.1 C) (Temporal)   Ht 5' 6 (1.676 m)   Wt 130 lb (59 kg)   SpO2 98%   BMI 20.98 kg/m  GEN: NAD EYE: Sclerae anicteric ENT: MMM CV: Non-tachycardic Pulm: CTA b/l GI: Soft, NT/ND NEURO:  Alert & Oriented x 3   Gordy Starch, MD [facility]  05/18/2024 11:50 AM

## 2024-05-18 NOTE — Progress Notes (Signed)
 Called to room to assist during endoscopic procedure.  Patient ID and intended procedure confirmed with present staff. Received instructions for my participation in the procedure from the performing physician.

## 2024-05-18 NOTE — Patient Instructions (Signed)

## 2024-05-18 NOTE — Op Note (Signed)
 [facility] Patient Name: Joan Bailey Procedure Date: 05/18/2024 11:45 AM MRN: [id number] Endoscopist: Gordy CHRISTELLA Starch , MD, [id number] Age: 63 Referring MD:  Date of Birth: 09-Dec-1960 Gender: Female Account #: [id number] Procedure:                Colonoscopy Indications:              High risk colon cancer surveillance: Personal                            history of sessile serrated colon polyp (less than                            10 mm in size) with no dysplasia, Last colonoscopy:                            October 2020 (SSP x 2, TA x 1, AIN s/p treatment                            with Dr. Debby) Medicines:                Monitored Anesthesia Care Procedure:                Pre-Anesthesia Assessment:                           - Prior to the procedure, a History and Physical                            was performed, and patient medications and                            allergies were reviewed. The patient's tolerance of                            previous anesthesia was also reviewed. The risks                            and benefits of the procedure and the sedation                            options and risks were discussed with the patient.                            All questions were answered, and informed consent                            was obtained. Prior Anticoagulants: The patient has                            taken no anticoagulant or antiplatelet agents. ASA                            Grade Assessment: II - A patient with mild systemic  disease. After reviewing the risks and benefits,                            the patient was deemed in satisfactory condition to                            undergo the procedure.                           After obtaining informed consent, the colonoscope                            was passed under direct vision. Throughout the                            procedure, the patient's blood pressure,  pulse, and                            oxygen saturations were monitored continuously. The                            Olympus Scope SN [id number] was introduced through the                            anus and advanced to the cecum, identified by                            appendiceal orifice and ileocecal valve. The                            colonoscopy was performed without difficulty. The                            patient tolerated the procedure well. The quality                            of the bowel preparation was good. The ileocecal                            valve, appendiceal orifice, and rectum were                            photographed. Scope In: 11:57:13 AM Scope Out: 12:13:01 PM Scope Withdrawal Time: 0 hours 11 minutes 31 seconds  Total Procedure Duration: 0 hours 15 minutes 48 seconds  Findings:                 The digital rectal exam was normal.                           A 3 mm polyp was found in the cecum. The polyp was                            sessile. The polyp was removed with a cold snare.  Resection and retrieval were complete.                           Two sessile polyps were found in the descending                            colon. The polyps were 3 to 4 mm in size. These                            polyps were removed with a cold snare. Resection                            and retrieval were complete.                           The retroflexed view of the distal rectum and anal                            verge was normal and showed no anal or rectal                            abnormalities. Complications:            No immediate complications. Estimated Blood Loss:     Estimated blood loss: none. Impression:               - One 3 mm polyp in the cecum, removed with a cold                            snare. Resected and retrieved.                           - Two 3 to 4 mm polyps in the descending colon,                            removed  with a cold snare. Resected and retrieved.                           - The distal rectum and anal verge are normal on                            retroflexion view. Recommendation:           - Patient has a contact number available for                            emergencies. The signs and symptoms of potential                            delayed complications were discussed with the                            patient. Return to normal activities tomorrow.  Written discharge instructions were provided to the                            patient.                           - Resume previous diet.                           - Continue present medications.                           - Await pathology results.                           - Repeat colonoscopy is recommended for                            surveillance. The colonoscopy date will be                            determined after pathology results from today's                            exam become available for review. Gordy CHRISTELLA Starch, MD 05/18/2024 12:17:51 PM This report has been signed electronically.

## 2024-05-18 NOTE — Progress Notes (Signed)
 I have reviewed the patient's medical history in detail and updated the computerized patient record.

## 2024-05-18 NOTE — Progress Notes (Signed)
 A/o x 3, VSS, good SR's, pleased with anesthesia, report to RN

## 2024-05-19 ENCOUNTER — Telehealth: Payer: Self-pay | Admitting: *Deleted

## 2024-05-19 NOTE — Telephone Encounter (Signed)
  Follow up Call-     05/18/2024   10:55 AM  Call back number  Post procedure Call Back phone  # [phone]  Permission to leave phone message Yes     Patient questions:  Do you have a fever, pain , or abdominal swelling? No. Pain Score  0 *  Have you tolerated food without any problems? Yes.    Have you been able to return to your normal activities? Yes.    Do you have any questions about your discharge instructions: Diet   No. Medications  No. Follow up visit  No.  Do you have questions or concerns about your Care? No.  Actions: * If pain score is 4 or above: No action needed, pain <4.

## 2024-05-20 ENCOUNTER — Ambulatory Visit: Payer: Self-pay | Admitting: Internal Medicine

## 2024-05-20 LAB — SURGICAL PATHOLOGY

## 2024-05-31 ENCOUNTER — Ambulatory Visit: Attending: Cardiovascular Disease | Admitting: Cardiovascular Disease

## 2024-05-31 ENCOUNTER — Encounter: Payer: Self-pay | Admitting: Cardiovascular Disease

## 2024-05-31 VITALS — BP 118/70 | HR 47 | Ht 66.0 in | Wt 130.0 lb

## 2024-05-31 DIAGNOSIS — I4719 Other supraventricular tachycardia: Secondary | ICD-10-CM | POA: Diagnosis not present

## 2024-05-31 DIAGNOSIS — I951 Orthostatic hypotension: Secondary | ICD-10-CM

## 2024-05-31 DIAGNOSIS — I4510 Unspecified right bundle-branch block: Secondary | ICD-10-CM

## 2024-05-31 DIAGNOSIS — R001 Bradycardia, unspecified: Secondary | ICD-10-CM

## 2024-05-31 NOTE — Progress Notes (Signed)
 Cardiology Consultation   Patient ID: Joan Bailey MRN: [id number]; DOB: [birth year]  Admit date: (Not on file) Date of Consult: 05/31/2024  PCP:  Irven Ozell DEL, MD   [facility] Providers Cardiologist:  None        Patient Profile: Joan Bailey is a 64 y.o. female with a hx of excellent health  who is being seen 05/31/2024 for the evaluation of smart watch reports of bradycardia at the request of Dr. Irven.  History of Present Illness:  Joan Bailey is a 63 year old female without known chronic medical problems who presents with concerns about a slow heart rate detected by her Apple Watch. She is accompanied by her husband, Bebe. She was referred by her primary care physician due to concerns about a slow heart rate.  She has been receiving notifications from her Apple Watch indicating a slow heart rate, specifically under 40 beats per minute. Despite these notifications, she does not experience symptoms directly related to the slow heart rate. However, she does report occasional lightheadedness when changing positions, such as moving from sitting or lying down to standing.  She experiences occasional palpitations described as 'flutters,' which are brief and occur roughly once or twice a day. These episodes last about three seconds and probably correlate with brief episodes of nonsustained SVT seen on her monitor probably correlating with brief episodes of SVT and are not particularly fast, with heart rates between 100 to 120 beats per minute. She has had a total of 22 episodes over a 14-day period.  For most of her adult life she conscientiously went to exercise at the gym at least 3 days a week.  Although she cannot exercise as intensely as she did in the past, she still exercises about twice a week, primarily walking on a treadmill and doing strength training. She used to run but has reduced high-impact activities due to knee concerns. She has a history of  engaging in high-impact aerobics for 35 years.  Past Medical History:  Diagnosis Date   AIN (anal intraepithelial neoplasia) anal canal    Depression    GERD (gastroesophageal reflux disease)    Hemorrhoids    Hiatal hernia    History of adenomatous polyp of colon    History of anal fissures    Wears contact lenses    Wears hearing aid in both ears     Past Surgical History:  Procedure Laterality Date   COLONOSCOPY  last one 05-28-2019    dr pyrtle   DE QUERVAIN'S RELEASE     GANGLION CYST EXCISION Left yrs ago   left foot   HIGH RESOLUTION ANOSCOPY N/A 10/14/2019   Procedure: HIGH RESOLUTION ANOSCOPY,WITH BIOPSY;  Surgeon: Debby Hila, MD;  Location: [facility] [place];  Service: General;  Laterality: N/A;   PERCUTANEOUS PINNING TOE FRACTURE Right 2017   great toe   STAPLE HEMORRHOIDECTOMY  2002       Scheduled Meds:  Continuous Infusions:  sodium chloride      PRN Meds:   Allergies:   No Known Allergies  Social History:   Social History   Socioeconomic History   Marital status: Married    Spouse name: Not on file   Number of children: Not on file   Years of education: Not on file   Highest education level: Not on file  Occupational History   Not on file  Tobacco Use   Smoking status: Never   Smokeless tobacco: Never  Vaping Use  Vaping status: Never Used  Substance and Sexual Activity   Alcohol use: Not Currently   Drug use: Never   Sexual activity: Not on file  Other Topics Concern   Not on file  Social History Narrative   Not on file   Social Drivers of Health   Financial Resource Strain: Not on file  Food Insecurity: Not on file  Transportation Needs: Not on file  Physical Activity: Not on file  Stress: Not on file  Social Connections: Not on file  Intimate Partner Violence: Not on file    Family History:    Family History  Problem Relation Age of Onset   Stomach cancer Maternal Grandfather    Pancreatic cancer Mother     Colon cancer Neg Hx    Colon polyps Neg Hx    Esophageal cancer Neg Hx    Rectal cancer Neg Hx      ROS:  Please see the history of present illness.   All other ROS reviewed and negative.     Physical Exam/Data: Vitals:   05/31/24 1130  BP: 118/70  Pulse: (!) 47  SpO2: 100%  Weight: 130 lb (59 kg)  Height: 5' 6 (1.676 m)   @IOBRIEF @    05/31/2024   11:30 AM 05/18/2024   10:55 AM 05/03/2024    8:23 AM  Last 3 Weights  Weight (lbs) 130 lb 130 lb 130 lb  Weight (kg) 58.968 kg 58.968 kg 58.968 kg     Body mass index is 20.98 kg/m.  General:  Well nourished, well developed, in no acute distress, appears lean and fit HEENT: normal Neck: no JVD Vascular: No carotid bruits; Distal pulses 2+ bilaterally Cardiac:  normal S1, widely split S2; RRR; no murmur  Lungs:  clear to auscultation bilaterally, no wheezing, rhonchi or rales  Abd: soft, nontender, no hepatomegaly  Ext: no edema Musculoskeletal:  No deformities, BUE and BLE strength normal and equal Skin: warm and dry  Neuro:  CNs 2-12 intact, no focal abnormalities noted Psych:  Normal affect   EKG:    EKG Interpretation Date/Time:  Monday May 31 2024 11:26:11 EST Ventricular Rate:  47 PR Interval:  152 QRS Duration:  108 QT Interval:  472 QTC Calculation: 417 R Axis:   88  Text Interpretation: Sinus bradycardia Right bundle branch block No previous ECGs available Confirmed by Tiearra Colwell ([id number]) on 05/31/2024 11:33:44 AM         Relevant CV Studies: 14 day ZIO report  Laboratory Data: High Sensitivity Troponin:  No results for input(s): TROPONINIHS in the last 720 hours.   ChemistryNo results for input(s): NA, K, CL, CO2, GLUCOSE, BUN, CREATININE, CALCIUM, MG, GFRNONAA, GFRAA, ANIONGAP in the last 168 hours.  No results for input(s): PROT, ALBUMIN, AST, ALT, ALKPHOS, BILITOT in the last 168 hours. Lipids No results for input(s): CHOL, TRIG, HDL,  LABVLDL, LDLCALC, CHOLHDL in the last 168 hours.  HematologyNo results for input(s): WBC, RBC, HGB, HCT, MCV, MCH, MCHC, RDW, PLT in the last 168 hours. Thyroid No results for input(s): TSH, FREET4 in the last 168 hours.  BNPNo results for input(s): BNP, PROBNP in the last 168 hours.  DDimer No results for input(s): DDIMER in the last 168 hours.  Radiology/Studies:  No results found.   Assessment and Plan: Bradycardia: Mild and asymptomatic.  Physiological circadian variation in heart rhythm.  Heart rate is often in the 40s while asleep, but increased to the 150s when exercising.  No indication for additional treatment  or evaluation at this time. Paroxysmal supraventricular tachycardia (SVT): Review of the rhythm monitor shows very brief episodes of nonsustained ectopic atrial tachycardia lasting just a few seconds (maximum 12 beats).  Again this is asymptomatic other than maybe occasional brief fluttering in her chest.  No treatment recommended. Right bundle branch block: No signs or symptoms to suggest significant right heart dysfunction.  No old ECG for comparison.  We discussed the potential progression to higher levels of conduction system disease, may be even need for pacemaker if she develops AV block in the future. Will consider echocardiogram if symptoms develop. Orthostatic hypotension: Occasional lightheadedness upon standing, likely due to orthostatic hypotension. No rhythm abnormalities associated with positional changes. Likely related to dehydration, especially in the morning.  Increase fluid intake, especially in the morning.  Monitor symptoms and adjust hydration as needed.  Risk Assessment/Risk Scores:            For questions or updates, please contact [facility] Please consult [email or web address] for contact info under      Signed, Jerel Balding, MD  05/31/2024 2:06 PM

## 2024-05-31 NOTE — Patient Instructions (Signed)
 Medication Instructions:  No changes *If you need a refill on your cardiac medications before your next appointment, please call your pharmacy*  Lab Work: None ordered If you have labs (blood work) drawn today and your tests are completely normal, you will receive your results only by: MyChart Message (if you have MyChart) OR A paper copy in the mail If you have any lab test that is abnormal or we need to change your treatment, we will call you to review the results.  Testing/Procedures: None ordered  Follow-Up: At [facility], you and your health needs are our priority.  As part of our continuing mission to provide you with exceptional heart care, our providers are all part of one team.  This team includes your primary Cardiologist (physician) and Advanced Practice Providers or APPs (Physician Assistants and Nurse Practitioners) who all work together to provide you with the care you need, when you need it.  Your next appointment:    Follow up as needed  Provider:   Dr Francyne  We recommend signing up for the patient portal called MyChart.  Sign up information is provided on this After Visit Summary.  MyChart is used to connect with patients for Virtual Visits (Telemedicine).  Patients are able to view lab/test results, encounter notes, upcoming appointments, etc.  Non-urgent messages can be sent to your provider as well.   To learn more about what you can do with MyChart, go to [email or web address].
